# Patient Record
Sex: Male | Born: 1955 | Race: Black or African American | Hispanic: No | Marital: Married | Smoking: Never smoker
Health system: Southern US, Community
[De-identification: ages and names within clinical notes are randomized; demographics above are authoritative.]

## PROBLEM LIST (undated history)

## (undated) DIAGNOSIS — I10 Essential (primary) hypertension: Secondary | ICD-10-CM

## (undated) DIAGNOSIS — E119 Type 2 diabetes mellitus without complications: Secondary | ICD-10-CM

## (undated) DIAGNOSIS — N200 Calculus of kidney: Secondary | ICD-10-CM

## (undated) HISTORY — PX: PROSTATE SURGERY: SHX751

## (undated) HISTORY — PX: CARPAL TUNNEL RELEASE: SHX101

## (undated) HISTORY — DX: Type 2 diabetes mellitus without complications: E11.9

---

## 2004-10-12 ENCOUNTER — Emergency Department: Payer: Self-pay | Admitting: General Practice

## 2011-03-21 ENCOUNTER — Emergency Department: Payer: Self-pay | Admitting: Unknown Physician Specialty

## 2013-08-24 ENCOUNTER — Emergency Department: Payer: Self-pay | Admitting: Emergency Medicine

## 2013-09-30 ENCOUNTER — Ambulatory Visit: Payer: Self-pay | Admitting: Physician Assistant

## 2013-09-30 LAB — URINALYSIS, COMPLETE
Blood: NEGATIVE
Glucose,UR: NEGATIVE mg/dL (ref 0–75)
Leukocyte Esterase: NEGATIVE
Ph: 6.5 (ref 4.5–8.0)
Specific Gravity: 1.025 (ref 1.003–1.030)

## 2014-08-04 DIAGNOSIS — R030 Elevated blood-pressure reading, without diagnosis of hypertension: Secondary | ICD-10-CM | POA: Insufficient documentation

## 2014-08-04 DIAGNOSIS — IMO0001 Reserved for inherently not codable concepts without codable children: Secondary | ICD-10-CM | POA: Insufficient documentation

## 2014-08-04 DIAGNOSIS — B354 Tinea corporis: Secondary | ICD-10-CM | POA: Insufficient documentation

## 2014-08-04 DIAGNOSIS — Z8639 Personal history of other endocrine, nutritional and metabolic disease: Secondary | ICD-10-CM | POA: Insufficient documentation

## 2014-08-04 DIAGNOSIS — L301 Dyshidrosis [pompholyx]: Secondary | ICD-10-CM | POA: Insufficient documentation

## 2014-09-11 ENCOUNTER — Ambulatory Visit: Payer: Self-pay | Admitting: Family Medicine

## 2014-09-19 ENCOUNTER — Ambulatory Visit: Payer: Self-pay | Admitting: Family Medicine

## 2014-10-20 ENCOUNTER — Ambulatory Visit: Payer: Self-pay | Admitting: Family Medicine

## 2014-11-20 ENCOUNTER — Ambulatory Visit: Payer: Self-pay | Admitting: Family Medicine

## 2014-11-29 ENCOUNTER — Ambulatory Visit (INDEPENDENT_AMBULATORY_CARE_PROVIDER_SITE_OTHER): Payer: No Typology Code available for payment source

## 2014-11-29 ENCOUNTER — Encounter: Payer: Self-pay | Admitting: Podiatry

## 2014-11-29 ENCOUNTER — Ambulatory Visit (INDEPENDENT_AMBULATORY_CARE_PROVIDER_SITE_OTHER): Payer: No Typology Code available for payment source | Admitting: Podiatry

## 2014-11-29 VITALS — BP 159/116 | HR 82 | Resp 16 | Ht 68.0 in | Wt 198.0 lb

## 2014-11-29 DIAGNOSIS — E119 Type 2 diabetes mellitus without complications: Secondary | ICD-10-CM

## 2014-11-29 DIAGNOSIS — S93402A Sprain of unspecified ligament of left ankle, initial encounter: Secondary | ICD-10-CM

## 2014-11-29 DIAGNOSIS — M19079 Primary osteoarthritis, unspecified ankle and foot: Secondary | ICD-10-CM

## 2014-11-29 DIAGNOSIS — M129 Arthropathy, unspecified: Secondary | ICD-10-CM

## 2014-11-29 MED ORDER — MELOXICAM 15 MG PO TABS: 15.0000 mg | ORAL_TABLET | Freq: Every day | ORAL | Status: DC

## 2014-11-29 NOTE — Progress Notes (Signed)
   Subjective:    Patient ID: Mitchell Ortiz, male    DOB: [birth year], 59 y.o.   MRN: [id number]  HPI Comments: Left ankle i hurt it. Shooting pain up in it. Hurt it a couple of months ago   Diabetic does not remember the last a1c , blood sugar in the morning was 130   Foot Pain      Review of Systems  Endocrine:       Diabetic   All other systems reviewed and are negative.      Objective:   Physical Exam : I have reviewed his past medical history medications allergies surgery social history review of systems. Vital signs demonstrate severe hypertension and I suggested that he follow-up with his PCP for that. Pulses are moderately palpable bilateral neurologic sensorium is intact per Semmes-Weinstein monofilament. Deep tendon reflexes are intact. Muscle strength +5 over 5 dorsiflexion plantar flexors and inverters everters all into the musculature is intact. Orthopedic evaluation demonstrates limited range of motion of the ankle joint left. He has tenderness on sharp inversion of the subtalar joint and of the ankle joint. 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        Assessment & Plan:  Assessment: Osteoarthritis left greater than right/capsulitis left ankle.  Plan: Injected the left ankle today with Kenalog and local anesthetic. Also wrote a prescription for a meloxicam and put him in a Tri-Lock brace after an anklet for compression. I will follow-up with him in 3-4 weeks to reevaluate. We may need to consider an MRI or CT scan for surgical evaluation.

## 2015-01-01 ENCOUNTER — Ambulatory Visit
Admit: 2015-01-01 | Disposition: A | Discharge status: Home / Self Care | Payer: Self-pay | Attending: Family Medicine | Admitting: Family Medicine

## 2015-01-10 ENCOUNTER — Ambulatory Visit (INDEPENDENT_AMBULATORY_CARE_PROVIDER_SITE_OTHER): Payer: No Typology Code available for payment source | Admitting: Podiatry

## 2015-01-10 VITALS — BP 135/88 | HR 93 | Resp 16

## 2015-01-10 DIAGNOSIS — M779 Enthesopathy, unspecified: Secondary | ICD-10-CM | POA: Diagnosis not present

## 2015-01-10 DIAGNOSIS — M7750 Other enthesopathy of unspecified foot: Secondary | ICD-10-CM

## 2015-01-11 NOTE — Progress Notes (Signed)
He presents today for follow-up of his left ankle. He states that he is approximately 100% better with the exception of this one small area right here as he points to the anterolateral aspect of his left ankle at the anterior talofibular ligament area.  Objective: Vital signs are stable he is alert and oriented 3. Pulses are palpable left. He has mild tenderness on palpation of the ATFL area of the left ankle. Otherwise the ankle has a good full range of motion he has mild tenderness on sharp plantar flexion in the posterior aspect of the ankle.  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  Plan: I reinjected the area today with dexamethasone and local anesthetic have to alleviate some of his symptomatology with this should this recur he will notify us immediately. I will consider sending him to Dr. Loreta AveWagner for surgical consideration

## 2015-08-14 IMAGING — CR DG CHEST 2V
1 series · 2 of 2 positions shown · non-contrast
Comparison: None.

CLINICAL DATA: Right scapular pain

EXAM:
CHEST  2 VIEW

[Series 1: w chest pa · 0.14mm/px · 2 of 2 slices shown]
[im 1/2]
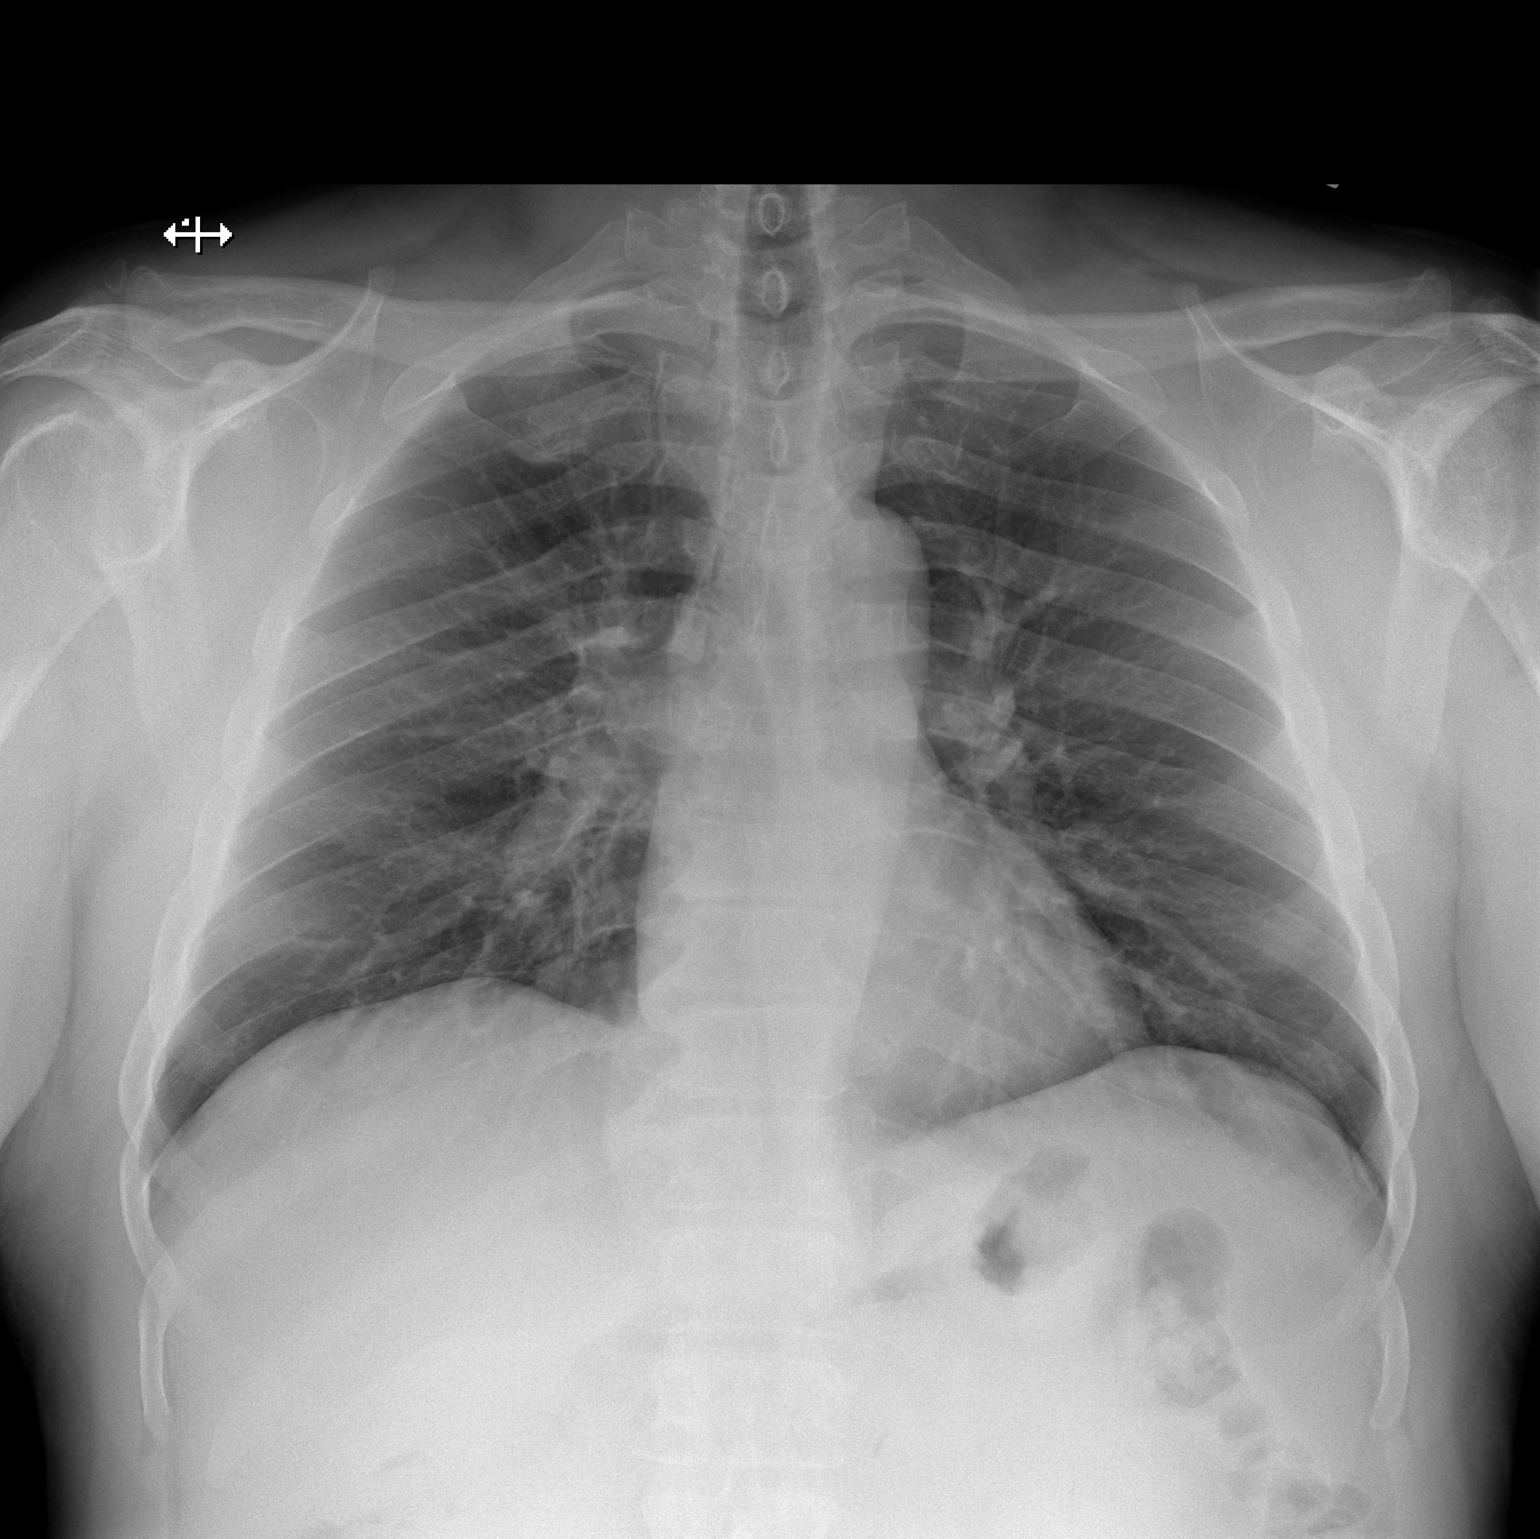
[im 2/2]
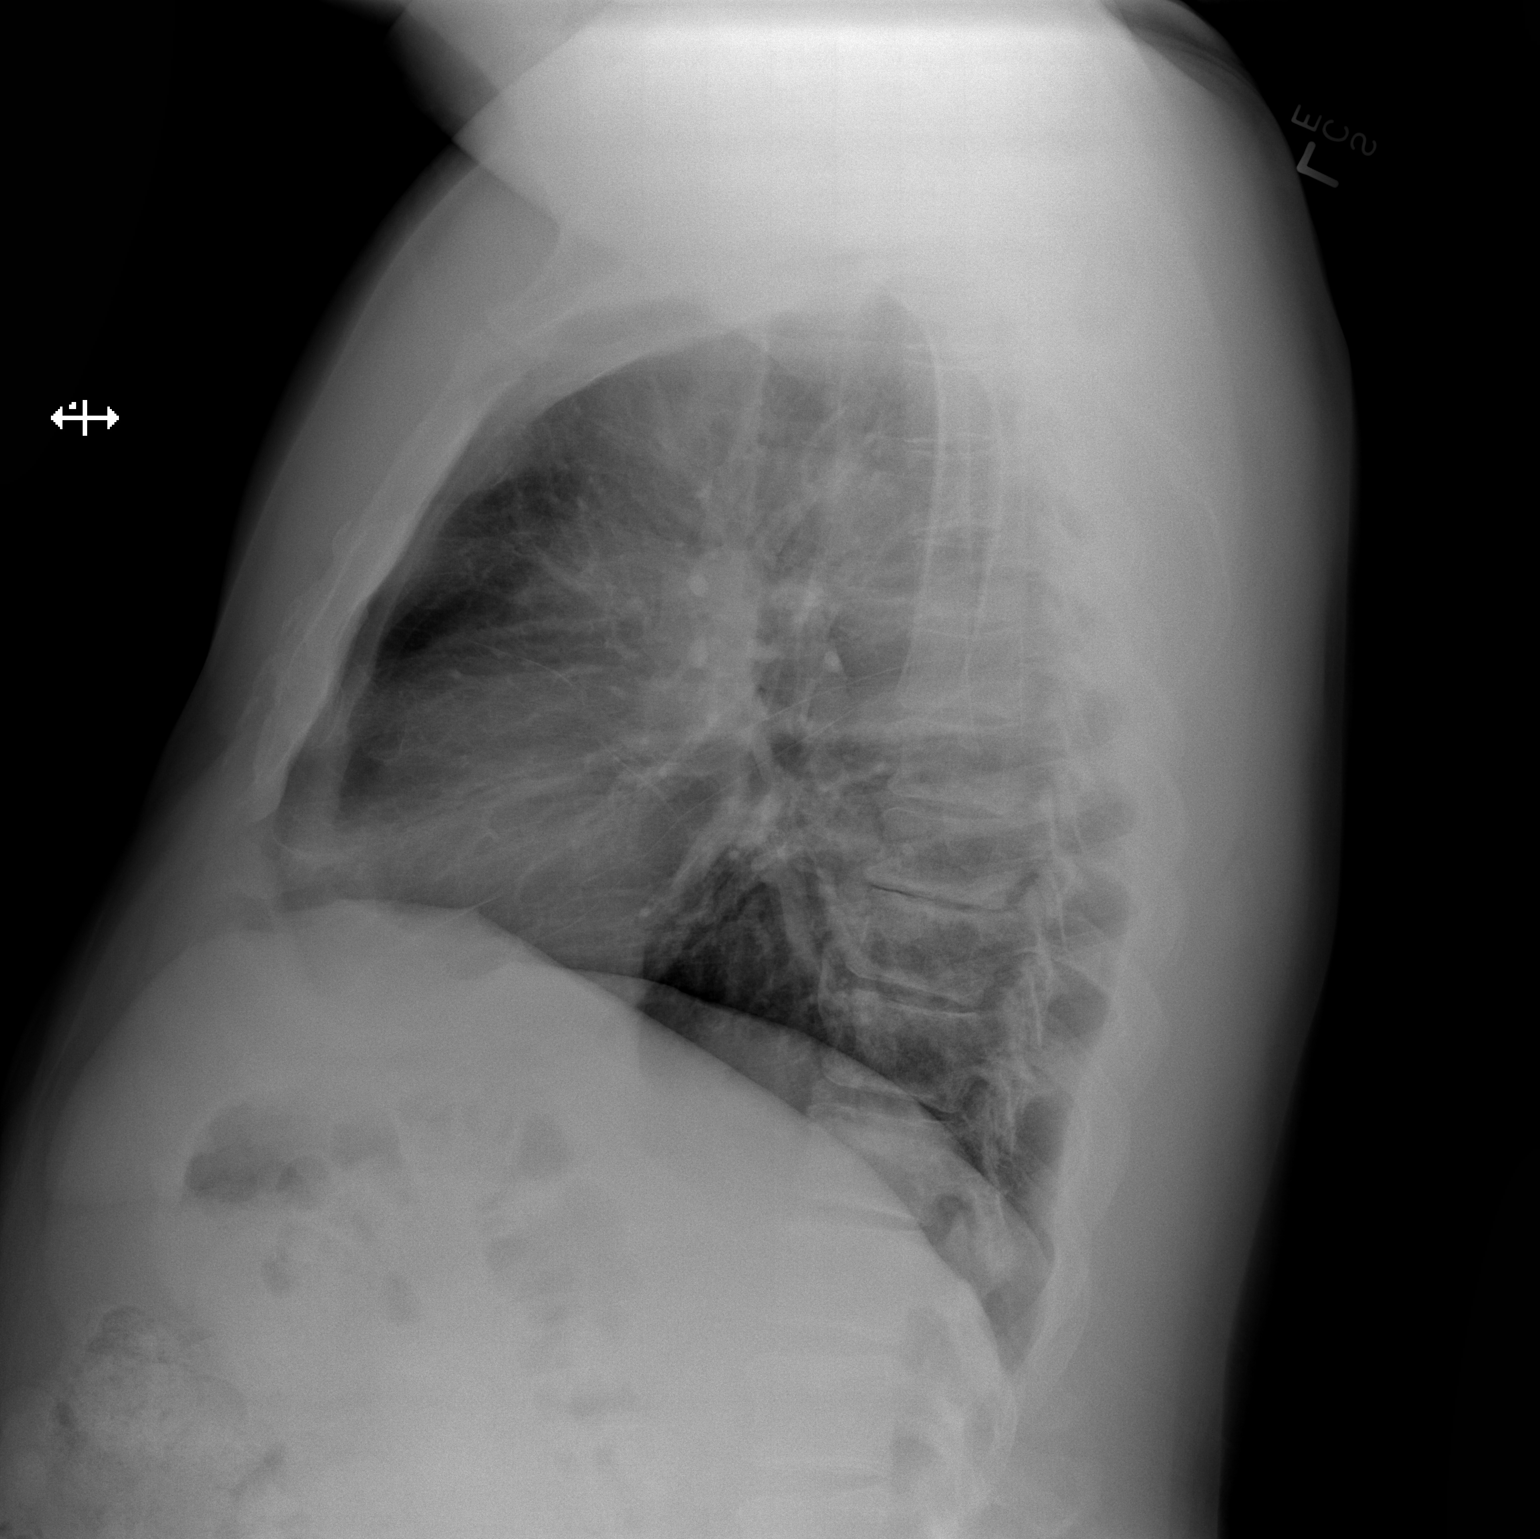

[2 of 2 positions shown; findings below may reference images not displayed]

FINDINGS: The heart size and mediastinal contours are within normal limits.
Both lungs are clear. The visualized skeletal structures are
unremarkable.
IMPRESSION: No active cardiopulmonary disease.

## 2016-08-21 DIAGNOSIS — E119 Type 2 diabetes mellitus without complications: Secondary | ICD-10-CM | POA: Insufficient documentation

## 2017-02-25 DIAGNOSIS — I10 Essential (primary) hypertension: Secondary | ICD-10-CM | POA: Insufficient documentation

## 2018-08-25 DIAGNOSIS — E785 Hyperlipidemia, unspecified: Secondary | ICD-10-CM | POA: Insufficient documentation

## 2018-08-30 ENCOUNTER — Encounter: Payer: Self-pay | Admitting: Emergency Medicine

## 2018-08-30 ENCOUNTER — Other Ambulatory Visit: Payer: Self-pay

## 2018-08-30 ENCOUNTER — Ambulatory Visit
Admission: EM | Admit: 2018-08-30 | Discharge: 2018-08-30 | Disposition: A | Discharge status: Other Acute Inpatient Hospital | Payer: Medicaid Other | Attending: Family Medicine | Admitting: Family Medicine

## 2018-08-30 DIAGNOSIS — R200 Anesthesia of skin: Secondary | ICD-10-CM

## 2018-08-30 DIAGNOSIS — R159 Full incontinence of feces: Secondary | ICD-10-CM

## 2018-08-30 HISTORY — DX: Calculus of kidney: N20.0

## 2018-08-30 NOTE — ED Triage Notes (Signed)
Patient c/o abdominal pain and numbness in bilateral legs since Friday. Patient stated he went to [facility] ER and was diagnosed with sciatica. Patient states he doesn't think that is the problem.

## 2018-08-30 NOTE — ED Provider Notes (Signed)
[facility]    CSN: [id number] Arrival date & time: 08/30/18  4098     History   Chief Complaint Chief Complaint  Patient presents with  . Abdominal Pain  . Numbness    HPI Mitchell Ortiz is a 62 y.o. male.   62 yo male with a c/o abdominal pain associated with bilateral numbness to his legs for the last 4 days. Patient states he was seen at [facility] ED  2 days ago and told he had sciatica. Patient states symptoms have worsened and he feels numbness to his inner thigh and groin area. States this morning he also had an episode where he was incontinent of stool. Denies any injuries, fevers, chills.   The history is provided by the patient.    Past Medical History:  Diagnosis Date  . Diabetes mellitus without complication (HCC)   . Kidney stone     Patient Active Problem List   Diagnosis Date Noted  . 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 Blood pressure elevated 08/04/2014  . H/O diabetes mellitus 08/04/2014  . Body tinea 08/04/2014    Past Surgical History:  Procedure Laterality Date  . CARPAL TUNNEL RELEASE         Home Medications    Prior to Admission medications   Medication Sig Start Date End Date Taking? Authorizing Provider  ketoconazole (NIZORAL) 2 % cream  11/07/14  Yes [provider]  meloxicam (MOBIC) 15 MG tablet Take 1 tablet (15 mg total) by mouth daily. 11/29/14  Yes Hyatt, Max T, DPM  metFORMIN (GLUCOPHAGE-XR) 500 MG 24 hr tablet  11/07/14  Yes [provider]    Family History History reviewed. No pertinent family history.  Social History Social History   Tobacco Use  . Smoking status: Never Smoker  . Smokeless tobacco: Never Used  Substance Use Topics  . Alcohol use: No    Alcohol/week: 0.0 standard drinks  . Drug use: Never     Allergies   Patient has no known allergies.   Review of Systems Review of Systems   Physical Exam Triage Vital Signs ED Triage Vitals  Enc Vitals Group     BP  08/30/18 0936 (!) 142/91     Pulse Rate 08/30/18 0936 80     Resp 08/30/18 0936 18     Temp 08/30/18 0936 98.1 F (36.7 C)     Temp Source 08/30/18 0936 Oral     SpO2 08/30/18 0936 100 %     Weight 08/30/18 0936 228 lb (103.4 kg)     Height 08/30/18 0936 5\' 7"  (1.702 m)     Head Circumference --      Peak Flow --      Pain Score 08/30/18 0935 10     Pain Loc --      Pain Edu? --      Excl. in GC? --    No data found.  Updated Vital Signs BP (!) 142/91 (BP Location: Right Arm)   Pulse 80   Temp 98.1 F (36.7 C) (Oral)   Resp 18   Ht 5\' 7"  (1.702 m)   Wt 103.4 kg   SpO2 100%   BMI 35.71 kg/m   Visual Acuity Right Eye Distance:   Left Eye Distance:   Bilateral Distance:    Right Eye Near:   Left Eye Near:    Bilateral Near:     Physical Exam  Constitutional: He is oriented to person, place, and time. He appears well-developed and  well-nourished.  Non-toxic appearance. He does not appear ill. No distress.  Cardiovascular: Normal rate, regular rhythm, normal heart sounds and intact distal pulses.  Pulmonary/Chest: Effort normal. No respiratory distress.  Abdominal: Soft. Bowel sounds are normal.  Neurological: He is alert and oriented to person, place, and time. He displays normal reflexes. A sensory deficit is present. No cranial nerve deficit. He exhibits normal muscle tone. Coordination normal.  Nursing note and vitals reviewed.    UC Treatments / Results  Labs (all labs ordered are listed, but only abnormal results are displayed) Labs Reviewed - No data to display  EKG None  Radiology No results found.  Procedures Procedures (including critical care time)  Medications Ordered in UC Medications - No data to display  Initial Impression / Assessment and Plan / UC Course  I have reviewed the triage vital signs and the nursing notes.  Pertinent labs & imaging results that were available during my care of the patient were reviewed by me and considered in  my medical decision making (see chart for details).      Final Clinical Impressions(s) / UC Diagnoses   Final diagnoses:  Bilateral leg numbness  Saddle anesthesia  Incontinence of feces, unspecified fecal incontinence type     Discharge Instructions     Recommend patient go to Emergency Department for further evaluation and management    ED Prescriptions    None     1. possible diagnosis reviewed with patient; recommend patient go to Emergency Department for further evaluation and management. Patient verbalizes understanding and will proceed by private vehicle with family member driving.     Controlled Substance Prescriptions [place] Controlled Substance Registry consulted? Not Applicable   Payton Mccallum, MD 08/30/18 1233

## 2018-08-30 NOTE — Discharge Instructions (Signed)
Recommend patient go to Emergency Department for further evaluation and management °

## 2019-07-25 DIAGNOSIS — Z8249 Family history of ischemic heart disease and other diseases of the circulatory system: Secondary | ICD-10-CM | POA: Insufficient documentation

## 2019-08-31 DIAGNOSIS — K429 Umbilical hernia without obstruction or gangrene: Secondary | ICD-10-CM | POA: Insufficient documentation

## 2019-10-21 HISTORY — PX: UMBILICAL HERNIA REPAIR: SHX196

## 2020-07-03 ENCOUNTER — Encounter: Payer: Self-pay | Admitting: Emergency Medicine

## 2020-07-03 ENCOUNTER — Ambulatory Visit
Admission: EM | Admit: 2020-07-03 | Discharge: 2020-07-03 | Disposition: A | Discharge status: Home / Self Care | Payer: Medicare Other | Attending: Emergency Medicine | Admitting: Emergency Medicine

## 2020-07-03 ENCOUNTER — Other Ambulatory Visit: Payer: Self-pay

## 2020-07-03 DIAGNOSIS — R600 Localized edema: Secondary | ICD-10-CM

## 2020-07-03 DIAGNOSIS — Z23 Encounter for immunization: Secondary | ICD-10-CM

## 2020-07-03 DIAGNOSIS — T63451A Toxic effect of venom of hornets, accidental (unintentional), initial encounter: Secondary | ICD-10-CM | POA: Diagnosis not present

## 2020-07-03 DIAGNOSIS — T63461A Toxic effect of venom of wasps, accidental (unintentional), initial encounter: Secondary | ICD-10-CM | POA: Diagnosis not present

## 2020-07-03 DIAGNOSIS — T63441A Toxic effect of venom of bees, accidental (unintentional), initial encounter: Secondary | ICD-10-CM | POA: Diagnosis not present

## 2020-07-03 MED ORDER — TETANUS-DIPHTH-ACELL PERTUSSIS 5-2.5-18.5 LF-MCG/0.5 IM SUSP
0.5000 mL | Freq: Once | INTRAMUSCULAR | Status: AC
  Administered 2020-07-03: 0.5 mL via INTRAMUSCULAR

## 2020-07-03 MED ORDER — IBUPROFEN 600 MG PO TABS: 600.0000 mg | ORAL_TABLET | Freq: Four times a day (QID) | ORAL | 0 refills | Status: DC | PRN

## 2020-07-03 MED ORDER — TRIAMCINOLONE ACETONIDE 0.1 % EX CREA: 1.0000 "application " | TOPICAL_CREAM | Freq: Two times a day (BID) | CUTANEOUS | 0 refills | Status: AC

## 2020-07-03 MED ORDER — HYDROXYZINE HCL 25 MG PO TABS: 25.0000 mg | ORAL_TABLET | Freq: Four times a day (QID) | ORAL | 0 refills | Status: DC | PRN

## 2020-07-03 MED ORDER — EPINEPHRINE 0.3 MG/0.3ML IJ SOAJ: 0.3000 mg | Freq: Once | INTRAMUSCULAR | 0 refills | Status: AC

## 2020-07-03 MED ORDER — FAMOTIDINE 20 MG PO TABS: 20.0000 mg | ORAL_TABLET | Freq: Two times a day (BID) | ORAL | 0 refills | Status: DC

## 2020-07-03 NOTE — Discharge Instructions (Addendum)
Take Claritin or Zyrtec, Pepcid, ibuprofen.  Cool compresses and triamcinolone cream to the areas of swelling.  This should help with the itching.  If the Claritin or Zyrtec does not work, you may use Atarax instead.  We have updated your tetanus today.  I am prescribing an EpiPen in case this progresses to a severe allergic reaction or in case this happens again.

## 2020-07-03 NOTE — ED Provider Notes (Addendum)
HPI  SUBJECTIVE:  Mitchell Ortiz is a 64 y.o. male who presents with multiple yellow jacket stings occurring 4 hours prior to arrival.  He states he was stung on his right forehead, left hand, left forearm, right shoulder and right abdomen.  He reports right periorbital, left hand swelling and forearm itching.  No visual changes, lip or tongue swelling, shortness of breath, wheezing, nausea, vomiting, diarrhea, abdominal pain, syncope, lightheadedness, dizziness.  No aggravating or alleviating factors.  He has not tried anything for this.  He has a past medical history of well-controlled diabetes on Metformin, hypertension, BPH.  No history of anaphylaxis, chronic kidney disease.  He is not sure when his last tetanus was.  PMD: Dr. Yetta Glassman   Past Medical History:  Diagnosis Date  . Diabetes mellitus without complication (HCC)   . Kidney stone     Past Surgical History:  Procedure Laterality Date  . CARPAL TUNNEL RELEASE      History reviewed. No pertinent family history.  Social History   Tobacco Use  . Smoking status: Never Smoker  . Smokeless tobacco: Never Used  Substance Use Topics  . Alcohol use: No    Alcohol/week: 0.0 standard drinks  . Drug use: Never    No current facility-administered medications for this encounter.  Current Outpatient Medications:  .  amLODipine (NORVASC) 10 MG tablet, , Disp: , Rfl:  .  aspirin 81 MG EC tablet, Take by mouth., Disp: , Rfl:  .  atorvastatin (LIPITOR) 10 MG tablet, Take 10 mg by mouth daily., Disp: , Rfl:  .  glipiZIDE (GLUCOTROL XL) 10 MG 24 hr tablet, Take by mouth., Disp: , Rfl:  .  losartan (COZAAR) 50 MG tablet, Take by mouth., Disp: , Rfl:  .  metFORMIN (GLUCOPHAGE-XR) 500 MG 24 hr tablet, , Disp: , Rfl:  .  tamsulosin (FLOMAX) 0.4 MG CAPS capsule, Take by mouth., Disp: , Rfl:  .  EPINEPHrine 0.3 mg/0.3 mL IJ SOAJ injection, Inject 0.3 mg into the muscle once for 1 dose., Disp: 0.3 mL, Rfl: 0 .  famotidine (PEPCID) 20 MG  tablet, Take 1 tablet (20 mg total) by mouth 2 (two) times daily., Disp: 10 tablet, Rfl: 0 .  hydrOXYzine (ATARAX/VISTARIL) 25 MG tablet, Take 1 tablet (25 mg total) by mouth every 6 (six) hours as needed for itching., Disp: 20 tablet, Rfl: 0 .  ibuprofen (ADVIL) 600 MG tablet, Take 1 tablet (600 mg total) by mouth every 6 (six) hours as needed., Disp: 20 tablet, Rfl: 0 .  triamcinolone cream (KENALOG) 0.1 %, Apply 1 application topically 2 (two) times daily. Apply for 2 weeks. May use on face, Disp: 30 g, Rfl: 0  No Known Allergies   ROS  As noted in HPI.   Physical Exam  BP 131/77 (BP Location: Right Arm)   Pulse 77   Temp 98.2 F (36.8 C) (Oral)   Resp 18   Ht 5\' 7"  (1.702 m)   Wt 98.9 kg   SpO2 100%   BMI 34.14 kg/m   Constitutional: Well developed, well nourished, no acute distress Eyes:  EOMI, conjunctiva normal bilaterally.  Positive nonerythematous periorbital swelling.      HENT: Normocephalic, atraumatic,mucus membranes moist.  No angioedema of the lips or tongue.  Airway widely patent. Respiratory: Normal inspiratory effort lungs clear bilaterally Cardiovascular: Normal rate regular rhythm no murmurs rubs or gallops GI: nondistended skin: Positive swelling dorsum left hand, forearm.  Erythematous nontender lesion on right abdomen and on right shoulder.  No stingers in any of these areas.  No flushing, urticaria.           Musculoskeletal: no deformities Neurologic: Alert & oriented x 3, no focal neuro deficits Psychiatric: Speech and behavior appropriate   ED Course   Medications  Tdap (BOOSTRIX) injection 0.5 mL (0.5 mLs Intramuscular Given 07/03/20 1146)    No orders of the defined types were placed in this encounter.   No results found for this or any previous visit (from the past 24 hour(s)). No results found.  ED Clinical Impression  1. Periorbital edema of right eye   2. Toxic reaction to hornets, wasps and bees, accidental or  unintentional, initial encounter   3. Hand edema      ED Assessment/Plan  Updating tetanus.  Patient with multiple yellowjacket stings and localized reaction.  Home with Claritin or Zyrtec, if this does not work, Atarax.  Cool compresses, ibuprofen, Pepcid.  Steroid cream to the areas of swelling because they are isolated and he has diabetes.  Follow-up with primary care physician in several days, to the ER if he gets worse.  He has no history of anaphylaxis or evidence of anaphylaxis here, but will give EpiPen in case this progresses to anaphylaxis.  It has been 4 hours since the incident, so I think that this is unlikely.  Discussed, MDM, treatment plan, and plan for follow-up with patient. Discussed sn/sx that should prompt return to the ED. patient agrees with plan.   Meds ordered this encounter  Medications  . Tdap (BOOSTRIX) injection 0.5 mL  . ibuprofen (ADVIL) 600 MG tablet    Sig: Take 1 tablet (600 mg total) by mouth every 6 (six) hours as needed.    Dispense:  20 tablet    Refill:  0  . famotidine (PEPCID) 20 MG tablet    Sig: Take 1 tablet (20 mg total) by mouth 2 (two) times daily.    Dispense:  10 tablet    Refill:  0  . EPINEPHrine 0.3 mg/0.3 mL IJ SOAJ injection    Sig: Inject 0.3 mg into the muscle once for 1 dose.    Dispense:  0.3 mL    Refill:  0  . triamcinolone cream (KENALOG) 0.1 %    Sig: Apply 1 application topically 2 (two) times daily. Apply for 2 weeks. May use on face    Dispense:  30 g    Refill:  0  . hydrOXYzine (ATARAX/VISTARIL) 25 MG tablet    Sig: Take 1 tablet (25 mg total) by mouth every 6 (six) hours as needed for itching.    Dispense:  20 tablet    Refill:  0    *This clinic note was created using Scientist, clinical (histocompatibility and immunogenetics). Therefore, there may be occasional mistakes despite careful proofreading.   ?    Domenick Gong, MD 07/04/20 1001    Domenick Gong, MD 07/04/20 1002

## 2020-07-03 NOTE — ED Triage Notes (Signed)
Patient c/o being stung by yellow jackets on his forehead, right shoulder, right abdomen and left hand. Patient is having swelling to his right eye and left hand.

## 2020-07-26 DIAGNOSIS — R350 Frequency of micturition: Secondary | ICD-10-CM | POA: Insufficient documentation

## 2020-07-26 DIAGNOSIS — N401 Enlarged prostate with lower urinary tract symptoms: Secondary | ICD-10-CM | POA: Insufficient documentation

## 2020-08-28 ENCOUNTER — Ambulatory Visit: Payer: No Typology Code available for payment source | Admitting: Podiatry

## 2020-08-30 ENCOUNTER — Ambulatory Visit (INDEPENDENT_AMBULATORY_CARE_PROVIDER_SITE_OTHER): Payer: Medicare Other

## 2020-08-30 ENCOUNTER — Other Ambulatory Visit: Payer: Self-pay

## 2020-08-30 ENCOUNTER — Ambulatory Visit (INDEPENDENT_AMBULATORY_CARE_PROVIDER_SITE_OTHER): Payer: Medicare Other | Admitting: Podiatry

## 2020-08-30 ENCOUNTER — Encounter: Payer: Self-pay | Admitting: Podiatry

## 2020-08-30 DIAGNOSIS — M779 Enthesopathy, unspecified: Secondary | ICD-10-CM | POA: Diagnosis not present

## 2020-08-30 DIAGNOSIS — M7752 Other enthesopathy of left foot: Secondary | ICD-10-CM | POA: Diagnosis not present

## 2020-08-31 NOTE — Progress Notes (Signed)
Subjective:  Patient ID: Mitchell Ortiz, male    DOB: 01-Jun-1956,  MRN: [id number]  Chief Complaint  Patient presents with  . Ankle Pain    Patient presents today for left ankle pain and swelling x 1 week    64 y.o. male presents with the above complaint.  Patient presents with left ankle pain that has been going for past 1 week.  There is some swelling associated with it.  Patient states the pain came out of nowhere is some numb feeling 1 for standing.  There are some sharp shooting pain as well.  Patient try Voltaren gel and Aleve Epson salt soaks none of that has helped.  He would like to get it evaluated make sure that there is nothing serious going on.  He denies any other acute complaints.  He is a type II diabetic with last A1c of that is unknown.   Review of Systems: Negative except as noted in the HPI. Denies N/V/F/Ch.  Past Medical History:  Diagnosis Date  . Diabetes mellitus without complication (HCC)   . Kidney stone     Current Outpatient Medications:  .  hydrocortisone 2.5 % cream, Apply 2x a day to affected area (Hips), Disp: , Rfl:  .  silodosin (RAPAFLO) 4 MG CAPS capsule, Take by mouth., Disp: , Rfl:  .  solifenacin (VESICARE) 5 MG tablet, Take by mouth., Disp: , Rfl:  .  amLODipine (NORVASC) 10 MG tablet, , Disp: , Rfl:  .  aspirin 81 MG EC tablet, Take by mouth., Disp: , Rfl:  .  atorvastatin (LIPITOR) 10 MG tablet, Take 10 mg by mouth daily., Disp: , Rfl:  .  econazole nitrate 1 % cream, Apply 1 application topically 2 (two) times daily., Disp: , Rfl:  .  EPINEPHrine 0.3 mg/0.3 mL IJ SOAJ injection, SMARTSIG:0.3 Milliliter(s) IM Once PRN, Disp: , Rfl:  .  famotidine (PEPCID) 20 MG tablet, Take 1 tablet (20 mg total) by mouth 2 (two) times daily., Disp: 10 tablet, Rfl: 0 .  glipiZIDE (GLUCOTROL XL) 10 MG 24 hr tablet, Take by mouth., Disp: , Rfl:  .  hydrOXYzine (ATARAX/VISTARIL) 25 MG tablet, Take 1 tablet (25 mg total) by mouth every 6 (six) hours as needed for  itching., Disp: 20 tablet, Rfl: 0 .  ibuprofen (ADVIL) 600 MG tablet, Take 1 tablet (600 mg total) by mouth every 6 (six) hours as needed., Disp: 20 tablet, Rfl: 0 .  losartan (COZAAR) 50 MG tablet, Take by mouth., Disp: , Rfl:  .  metFORMIN (GLUCOPHAGE-XR) 500 MG 24 hr tablet, , Disp: , Rfl:  .  naproxen (NAPROSYN) 375 MG tablet, Take 375 mg by mouth 2 (two) times daily., Disp: , Rfl:  .  tamsulosin (FLOMAX) 0.4 MG CAPS capsule, Take by mouth., Disp: , Rfl:  .  triamcinolone cream (KENALOG) 0.1 %, Apply 1 application topically 2 (two) times daily. Apply for 2 weeks. May use on face, Disp: 30 g, Rfl: 0  Social History   Tobacco Use  Smoking Status Never Smoker  Smokeless Tobacco Never Used    No Known Allergies Objective:  There were no vitals filed for this visit. There is no height or weight on file to calculate BMI. Constitutional Well developed. Well nourished.  Vascular Dorsalis pedis pulses palpable bilaterally. Posterior tibial pulses palpable bilaterally. Capillary refill normal to all digits.  No cyanosis or clubbing noted. Pedal hair growth normal.  Neurologic Normal speech. Oriented to person, place, and time. Epicritic sensation to light touch  grossly present bilaterally.  Dermatologic Nails well groomed and normal in appearance. No open wounds. No skin lesions.  Orthopedic:  Pain on palpation across the ankle joint medial to lateral gutter.  Pain with range of motion of the ankle joint.  No deep intra-articular pain noted.  No pain at the ATFL, peroneal tendon, posterior tibial tendon, Achilles tendon noted.   Radiographs: 3 views of skeletally mature adult left ankle: Severe osteoarthrosis noted of the ankle joint with uneven joint space narrowing osteophytes.  Midfoot arthrosis noted as well.  Assessment:   1. Tendinitis    Plan:  Patient was evaluated and treated and all questions answered.  Left ankle capsulitis/osteoarthritis -I explained the patient the  etiology of ankle capsulitis and various treatment options were extensively discussed.  Given the amount of pain that he is having I believe patient will benefit from a steroid injection.  This should help decrease some of the osteoarthritic changes are going on the joint and decrease the pain associated from inflammatory conditions in the joint.  I discussed this with patient in extensive detail.  Given the amount of arthritis that he has I am hoping that the steroid injection can give him some relief if not patient is ideal candidate for fusion versus ankle arthroplasty. -A steroid injection was performed at left ankle joint using 1% plain Lidocaine and 10 mg of Kenalog. This was well tolerated.   No follow-ups on file.

## 2020-10-26 DIAGNOSIS — L280 Lichen simplex chronicus: Secondary | ICD-10-CM | POA: Insufficient documentation

## 2020-11-22 ENCOUNTER — Ambulatory Visit
Admission: EM | Admit: 2020-11-22 | Discharge: 2020-11-22 | Disposition: A | Discharge status: Home / Self Care | Payer: Medicare Other | Attending: Emergency Medicine | Admitting: Emergency Medicine

## 2020-11-22 ENCOUNTER — Ambulatory Visit (INDEPENDENT_AMBULATORY_CARE_PROVIDER_SITE_OTHER): Payer: Medicare Other

## 2020-11-22 ENCOUNTER — Other Ambulatory Visit: Payer: Self-pay

## 2020-11-22 DIAGNOSIS — R062 Wheezing: Secondary | ICD-10-CM

## 2020-11-22 DIAGNOSIS — R059 Cough, unspecified: Secondary | ICD-10-CM

## 2020-11-22 DIAGNOSIS — J209 Acute bronchitis, unspecified: Secondary | ICD-10-CM

## 2020-11-22 HISTORY — DX: Calculus of kidney: N20.0

## 2020-11-22 MED ORDER — ALBUTEROL SULFATE HFA 108 (90 BASE) MCG/ACT IN AERS: 2.0000 | INHALATION_SPRAY | RESPIRATORY_TRACT | 0 refills | Status: AC | PRN

## 2020-11-22 MED ORDER — BENZONATATE 100 MG PO CAPS: 200.0000 mg | ORAL_CAPSULE | Freq: Three times a day (TID) | ORAL | 0 refills | Status: DC

## 2020-11-22 MED ORDER — PREDNISONE 10 MG (21) PO TBPK: ORAL_TABLET | Freq: Every day | ORAL | 0 refills | Status: DC

## 2020-11-22 MED ORDER — PROMETHAZINE-DM 6.25-15 MG/5ML PO SYRP: 5.0000 mL | ORAL_SOLUTION | Freq: Four times a day (QID) | ORAL | 0 refills | Status: DC | PRN

## 2020-11-22 MED ORDER — AEROCHAMBER MV MISC: 2 refills | Status: DC

## 2020-11-22 NOTE — ED Provider Notes (Signed)
[facility]    CSN: [id number] Arrival date & time: 11/22/20  1628      History   Chief Complaint Chief Complaint  Patient presents with  . chest congestion    HPI Mitchell Ortiz is a 65 y.o. male.   HPI   65 year old male here for evaluation of wheezing and productive cough x2 weeks.  Reports that he has been coughing up green mucus.  His cough increases when he lays flat.  Patient denies shortness of breath, sore throat, fever, or ear pressure.  Patient has had some intermittent nasal congestion.  Past Medical History:  Diagnosis Date  . Diabetes mellitus without complication (HCC)   . Kidney stone   . Kidney stones     Patient Active Problem List   Diagnosis Date Noted  . Benign prostatic hyperplasia with urinary frequency 07/26/2020  . Umbilical hernia without obstruction and without gangrene 08/31/2019  . Family history of heart disease in brother 07/25/2019  . Hyperlipidemia 08/25/2018  . Hypertension 02/25/2017  . Type 2 diabetes mellitus without complication, without long-term current use of insulin (HCC) 08/21/2016  . 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 Blood pressure elevated 08/04/2014  . H/O diabetes mellitus 08/04/2014  . Body tinea 08/04/2014  . Blood pressure elevated without history of HTN 08/04/2014    Past Surgical History:  Procedure Laterality Date  . CARPAL TUNNEL RELEASE    . UMBILICAL HERNIA REPAIR  2021       Home Medications    Prior to Admission medications   Medication Sig Start Date End Date Taking? Authorizing Provider  albuterol (VENTOLIN HFA) 108 (90 Base) MCG/ACT inhaler Inhale 2 puffs into the lungs every 4 (four) hours as needed. 11/22/20  Yes Becky Augusta, NP  amLODipine (NORVASC) 10 MG tablet  06/15/20  Yes [provider]  aspirin 81 MG EC tablet Take by mouth. 05/26/16  Yes [provider]  atorvastatin (LIPITOR) 10 MG tablet Take 10 mg by mouth daily. 06/26/20  Yes [provider]   benzonatate (TESSALON) 100 MG capsule Take 2 capsules (200 mg total) by mouth every 8 (eight) hours. 11/22/20  Yes Becky Augusta, NP  econazole nitrate 1 % cream Apply 1 application topically 2 (two) times daily. 04/19/20  Yes [provider]  EPINEPHrine 0.3 mg/0.3 mL IJ SOAJ injection SMARTSIG:0.3 Milliliter(s) IM Once PRN 07/03/20  Yes [provider]  glipiZIDE (GLUCOTROL XL) 10 MG 24 hr tablet Take by mouth. 06/27/16  Yes [provider]  hydrocortisone 2.5 % cream Apply 2x a day to affected area (Hips) 08/06/17  Yes [provider]  losartan (COZAAR) 50 MG tablet Take by mouth. 05/15/20 05/15/21 Yes [provider]  metFORMIN (GLUCOPHAGE-XR) 500 MG 24 hr tablet  11/07/14  Yes [provider]  naproxen (NAPROSYN) 375 MG tablet Take 375 mg by mouth 2 (two) times daily. 04/23/20  Yes [provider]  predniSONE (STERAPRED UNI-PAK 21 TAB) 10 MG (21) TBPK tablet Take by mouth daily. Take 6 tabs by mouth daily  for 2 days, then 5 tabs for 2 days, then 4 tabs for 2 days, then 3 tabs for 2 days, 2 tabs for 2 days, then 1 tab by mouth daily for 2 days 11/22/20  Yes Becky Augusta, NP  promethazine-dextromethorphan (PROMETHAZINE-DM) 6.25-15 MG/5ML syrup Take 5 mLs by mouth 4 (four) times daily as needed. 11/22/20  Yes Becky Augusta, NP  silodosin (RAPAFLO) 4 MG CAPS capsule Take by mouth. 07/24/20  Yes [provider]  solifenacin (VESICARE) 5 MG tablet Take by mouth. 07/24/20 07/24/21 Yes [provider]  Spacer/Aero-Holding Chambers (AEROCHAMBER MV) inhaler Use as instructed 11/22/20  Yes Becky Augusta, NP  tamsulosin [facility]) 0.4 MG CAPS capsule Take by mouth. 03/15/20 03/15/21 Yes [provider]  triamcinolone cream (KENALOG) 0.1 % Apply 1 application topically 2 (two) times daily. Apply for 2 weeks. May use on face 07/03/20  Yes Domenick Gong, MD  famotidine (PEPCID) 20 MG tablet Take 1 tablet (20 mg total) by mouth 2 (two) times  daily. 07/03/20 11/22/20  Domenick Gong, MD    Family History History reviewed. No pertinent family history.  Social History Social History   Tobacco Use  . Smoking status: Never Smoker  . Smokeless tobacco: Never Used  Vaping Use  . Vaping Use: Never used  Substance Use Topics  . Alcohol use: No    Alcohol/week: 0.0 standard drinks  . Drug use: Never     Allergies   Patient has no known allergies.   Review of Systems Review of Systems  Constitutional: Negative for activity change, appetite change and fever.  HENT: Positive for congestion. Negative for ear pain, rhinorrhea and sore throat.   Respiratory: Positive for cough and wheezing. Negative for shortness of breath.   Musculoskeletal: Negative for arthralgias and myalgias.  Skin: Negative for rash.  Hematological: Negative.   Psychiatric/Behavioral: Negative.      Physical Exam Triage Vital Signs ED Triage Vitals  Enc Vitals Group     BP 11/22/20 1639 137/86     Pulse Rate 11/22/20 1639 86     Resp 11/22/20 1639 18     Temp 11/22/20 1639 98.1 F (36.7 C)     Temp Source 11/22/20 1639 Oral     SpO2 11/22/20 1639 96 %     Weight 11/22/20 1636 216 lb (98 kg)     Height 11/22/20 1636 5\' 7"  (1.702 m)     Head Circumference --      Peak Flow --      Pain Score 11/22/20 1636 0     Pain Loc --      Pain Edu? --      Excl. in GC? --    No data found.  Updated Vital Signs BP 137/86 (BP Location: Left Arm)   Pulse 86   Temp 98.1 F (36.7 C) (Oral)   Resp 18   Ht 5\' 7"  (1.702 m)   Wt 216 lb (98 kg)   SpO2 96%   BMI 33.83 kg/m   Visual Acuity Right Eye Distance:   Left Eye Distance:   Bilateral Distance:    Right Eye Near:   Left Eye Near:    Bilateral Near:     Physical Exam Vitals and nursing note reviewed.  Constitutional:      General: He is not in acute distress.    Appearance: Normal appearance. He is not toxic-appearing.  HENT:     Head: Normocephalic and atraumatic.     Right Ear:  Tympanic membrane, ear canal and external ear normal.     Left Ear: Tympanic membrane, ear canal and external ear normal.     Nose: Congestion and rhinorrhea present.     Comments: The mucosa is mildly erythematous and edematous with scant clear nasal discharge.    Mouth/Throat:     Mouth: Mucous membranes are moist.     Pharynx: Oropharynx is clear. No posterior oropharyngeal erythema.  Cardiovascular:     Rate and Rhythm: Normal  rate and regular rhythm.     Pulses: Normal pulses.     Heart sounds: Normal heart sounds.  Pulmonary:     Effort: Pulmonary effort is normal.     Breath sounds: Normal breath sounds. No wheezing, rhonchi or rales.  Musculoskeletal:     Cervical back: Normal range of motion and neck supple.  Lymphadenopathy:     Cervical: No cervical adenopathy.  Skin:    General: Skin is warm and dry.     Capillary Refill: Capillary refill takes less than 2 seconds.     Findings: No erythema or rash.  Neurological:     General: No focal deficit present.     Mental Status: He is alert and oriented to person, place, and time.  Psychiatric:        Mood and Affect: Mood normal.        Behavior: Behavior normal.        Thought Content: Thought content normal.        Judgment: Judgment normal.      UC Treatments / Results  Labs (all labs ordered are listed, but only abnormal results are displayed) Labs Reviewed - No data to display  EKG   Radiology DG Chest 2 View  Result Date: 11/22/2020 CLINICAL DATA:  65 year old male with cough and wheezing. EXAM: CHEST - 2 VIEW COMPARISON:  Chest radiograph dated 08/24/2013. FINDINGS: No focal consolidation, pleural effusion, or pneumothorax. The cardiac silhouette is within limits. No acute osseous pathology. Degenerative changes of the spine. IMPRESSION: No active cardiopulmonary disease. Electronically Signed   By: Elgie Collard M.D.   On: 11/22/2020 17:19    Procedures Procedures (including critical care  time)  Medications Ordered in UC Medications - No data to display  Initial Impression / Assessment and Plan / UC Course  I have reviewed the triage vital signs and the nursing notes.  Pertinent labs & imaging results that were available during my care of the patient were reviewed by me and considered in my medical decision making (see chart for details).   Patient is a very pleasant 65 year old male here for evaluation of productive cough and wheezing that have been going on for last 2 weeks.  Patient states that he is producing a green sputum when he coughs.  Patient is not tachypneic and can speak in full sentences.  Lungs are clear to auscultation in all fields.  Patient states that he typically gets sick with a respiratory illness this time of year and several times it has turned into pneumonia.  Will obtain chest x-ray.  X-ray is negative for pneumonia per radiology read.  We will treat patient for bronchitis with prednisone, Tessalon Perles, Promethazine DM, and albuterol inhaler with a spacer.   Final Clinical Impressions(s) / UC Diagnoses   Final diagnoses:  Acute bronchitis, unspecified organism     Discharge Instructions     Chest x-ray did not show any evidence of pneumonia.  Your symptoms are more consistent with bronchitis.  Take the prednisone as prescribed starting tomorrow morning with breakfast to help with inflammation.  Use the albuterol inhaler with a spacer, 2 puffs every 4-6 hours, as needed for wheezing.  Use the Tessalon Perles during the day as needed for cough.  Take them with a small sip of water.  They may give you a numbness to the base of your tongue and a metallic taste in her mouth, this is normal.  Use the Promethazine DM cough syrup at bedtime for cough  and congestion.  This will make you drowsy.  Follow-up with your primary care provider for any new or worsening symptoms.    ED Prescriptions    Medication Sig Dispense Auth. Provider    albuterol (VENTOLIN HFA) 108 (90 Base) MCG/ACT inhaler Inhale 2 puffs into the lungs every 4 (four) hours as needed. 18 g Becky Augusta, NP   Spacer/Aero-Holding Chambers (AEROCHAMBER MV) inhaler Use as instructed 1 each Becky Augusta, NP   benzonatate (TESSALON) 100 MG capsule Take 2 capsules (200 mg total) by mouth every 8 (eight) hours. 21 capsule Becky Augusta, NP   promethazine-dextromethorphan (PROMETHAZINE-DM) 6.25-15 MG/5ML syrup Take 5 mLs by mouth 4 (four) times daily as needed. 118 mL Becky Augusta, NP   predniSONE (STERAPRED UNI-PAK 21 TAB) 10 MG (21) TBPK tablet Take by mouth daily. Take 6 tabs by mouth daily  for 2 days, then 5 tabs for 2 days, then 4 tabs for 2 days, then 3 tabs for 2 days, 2 tabs for 2 days, then 1 tab by mouth daily for 2 days 42 tablet Becky Augusta, NP     PDMP not reviewed this encounter.   Becky Augusta, NP 11/22/20 1736

## 2020-11-22 NOTE — Discharge Instructions (Addendum)
Chest x-ray did not show any evidence of pneumonia.  Your symptoms are more consistent with bronchitis.  Take the prednisone as prescribed starting tomorrow morning with breakfast to help with inflammation.  Use the albuterol inhaler with a spacer, 2 puffs every 4-6 hours, as needed for wheezing.  Use the Tessalon Perles during the day as needed for cough.  Take them with a small sip of water.  They may give you a numbness to the base of your tongue and a metallic taste in her mouth, this is normal.  Use the Promethazine DM cough syrup at bedtime for cough and congestion.  This will make you drowsy.  Follow-up with your primary care provider for any new or worsening symptoms.

## 2020-11-22 NOTE — ED Triage Notes (Signed)
Pt c/o productive cough with green mucus for about 2 weeks. Pt states he does have hx of pna. Pt is concerned he may have it again. Pt denies f/n/v/d or other symptoms. Pt denies any known sick contacts. Pt states he regularly gets sick around this time of year.

## 2021-01-11 DIAGNOSIS — N528 Other male erectile dysfunction: Secondary | ICD-10-CM | POA: Insufficient documentation

## 2021-02-12 ENCOUNTER — Ambulatory Visit (INDEPENDENT_AMBULATORY_CARE_PROVIDER_SITE_OTHER): Payer: 59 | Admitting: Podiatry

## 2021-02-12 ENCOUNTER — Encounter: Payer: Self-pay | Admitting: Podiatry

## 2021-02-12 ENCOUNTER — Other Ambulatory Visit: Payer: Self-pay

## 2021-02-12 DIAGNOSIS — M19072 Primary osteoarthritis, left ankle and foot: Secondary | ICD-10-CM

## 2021-02-12 DIAGNOSIS — M7752 Other enthesopathy of left foot: Secondary | ICD-10-CM

## 2021-02-12 DIAGNOSIS — Z01818 Encounter for other preprocedural examination: Secondary | ICD-10-CM | POA: Diagnosis not present

## 2021-02-12 NOTE — Progress Notes (Signed)
Subjective:  Patient ID: Mitchell Ortiz, male    DOB: September 14, 1956,  MRN: [id number]  Chief Complaint  Patient presents with  . Ankle Pain    Patient presents today with continued ankle pain and swelling bilat    65 y.o. male presents with the above complaint.  Patient presents with left ankle pain that did not get resolved as much with steroid injection.  Patient states that steroid injection lasted for about 3 weeks do not last any longer.  He would like to reschedule another steroid injection.  He would also like to discuss surgical options as he has not gotten any relief from bracing immobilization.  Patient states it continues to hurt with every step.  He states that there is a lot of arthritis in that ankle.  He denies any other acute complaints.  He is an diabetic with last A1c of 8.5%.   Review of Systems: Negative except as noted in the HPI. Denies N/V/F/Ch.  Past Medical History:  Diagnosis Date  . Diabetes mellitus without complication (HCC)   . Kidney stone   . Kidney stones     Current Outpatient Medications:  .  albuterol (VENTOLIN HFA) 108 (90 Base) MCG/ACT inhaler, Inhale 2 puffs into the lungs every 4 (four) hours as needed., Disp: 18 g, Rfl: 0 .  amLODipine (NORVASC) 10 MG tablet, , Disp: , Rfl:  .  aspirin 81 MG EC tablet, Take by mouth., Disp: , Rfl:  .  atorvastatin (LIPITOR) 10 MG tablet, Take 10 mg by mouth daily., Disp: , Rfl:  .  benzonatate (TESSALON) 100 MG capsule, Take 2 capsules (200 mg total) by mouth every 8 (eight) hours., Disp: 21 capsule, Rfl: 0 .  econazole nitrate 1 % cream, Apply 1 application topically 2 (two) times daily., Disp: , Rfl:  .  EPINEPHrine 0.3 mg/0.3 mL IJ SOAJ injection, SMARTSIG:0.3 Milliliter(s) IM Once PRN, Disp: , Rfl:  .  glipiZIDE (GLUCOTROL XL) 10 MG 24 hr tablet, Take by mouth., Disp: , Rfl:  .  hydrocortisone 2.5 % cream, Apply 2x a day to affected area (Hips), Disp: , Rfl:  .  losartan (COZAAR) 50 MG tablet, Take by mouth.,  Disp: , Rfl:  .  metFORMIN (GLUCOPHAGE-XR) 500 MG 24 hr tablet, , Disp: , Rfl:  .  naproxen (NAPROSYN) 375 MG tablet, Take 375 mg by mouth 2 (two) times daily., Disp: , Rfl:  .  predniSONE (STERAPRED UNI-PAK 21 TAB) 10 MG (21) TBPK tablet, Take by mouth daily. Take 6 tabs by mouth daily  for 2 days, then 5 tabs for 2 days, then 4 tabs for 2 days, then 3 tabs for 2 days, 2 tabs for 2 days, then 1 tab by mouth daily for 2 days, Disp: 42 tablet, Rfl: 0 .  promethazine-dextromethorphan (PROMETHAZINE-DM) 6.25-15 MG/5ML syrup, Take 5 mLs by mouth 4 (four) times daily as needed., Disp: 118 mL, Rfl: 0 .  silodosin (RAPAFLO) 4 MG CAPS capsule, Take by mouth., Disp: , Rfl:  .  solifenacin (VESICARE) 5 MG tablet, Take by mouth., Disp: , Rfl:  .  Spacer/Aero-Holding Chambers (AEROCHAMBER MV) inhaler, Use as instructed, Disp: 1 each, Rfl: 2 .  tamsulosin (FLOMAX) 0.4 MG CAPS capsule, Take by mouth., Disp: , Rfl:  .  triamcinolone cream (KENALOG) 0.1 %, Apply 1 application topically 2 (two) times daily. Apply for 2 weeks. May use on face, Disp: 30 g, Rfl: 0  Social History   Tobacco Use  Smoking Status Never Smoker  Smokeless Tobacco  Never Used    No Known Allergies Objective:  There were no vitals filed for this visit. There is no height or weight on file to calculate BMI. Constitutional Well developed. Well nourished.  Vascular Dorsalis pedis pulses palpable bilaterally. Posterior tibial pulses palpable bilaterally. Capillary refill normal to all digits.  No cyanosis or clubbing noted. Pedal hair growth normal.  Neurologic Normal speech. Oriented to person, place, and time. Epicritic sensation to light touch grossly present bilaterally.  Dermatologic Nails well groomed and normal in appearance. No open wounds. No skin lesions.  Orthopedic:  Pain on palpation across the ankle joint medial to lateral gutter.  Pain with range of motion of the ankle joint.  No deep intra-articular pain noted.  No  pain at the ATFL, peroneal tendon, posterior tibial tendon, Achilles tendon noted.   Radiographs: 3 views of skeletally mature adult left ankle: Severe osteoarthrosis noted of the ankle joint with uneven joint space narrowing osteophytes.  Midfoot arthrosis noted as well.  Assessment:   1. Preoperative examination   2. Arthritis of ankle, left   3. Capsulitis of ankle, left    Plan:  Patient was evaluated and treated and all questions answered.  Left ankle capsulitis/osteoarthritis with underlying uncontrolled diabetes -I explained the patient the etiology of ankle capsulitis and various treatment options were extensively discussed.  Given the amount of pain that he is having I believe patient will benefit from a steroid injection.   -A second steroid injection was performed at left ankle joint using 1% plain Lidocaine and 10 mg of Kenalog. This was well tolerated. -I discussed with him in extensive detail given the amount of arthritis that is present I believe ultimately patient will benefit from surgical fusion/arthroplasty as he has severe amount of arthritis in that ankle joint.  However, given his diabetic and his A1c of 8.5% I believe minimal options are the best options for him.  I discussed with him that given the amount of arthritis he may not benefit from arthroscopy however he would like to still proceed with that to allow decreasing arthritis and some of the pain.  I discussed with him in extensive detail that he may not be able to get get the relief he is looking for given the nature of the arthritis however he still like proceed with it.  He does have 8.5% A1c.  I educated him to decrease that.  He states understanding will work on that.  For now he may benefit from minimal portals with ankle arthroscopy with debridement/intervention.  He would like to proceed with the surgery despite all the risk and complication including wound healing. -He will be weightbearing as tolerated in  surgical shoe -Informed surgical risk consent was reviewed and read aloud to the patient.  I reviewed the films.  I have discussed my findings with the patient in great detail.  I have discussed all risks including but not limited to infection, stiffness, scarring, limp, disability, deformity, damage to blood vessels and nerves, numbness, poor healing, need for braces, arthritis, chronic pain, amputation, death.  All benefits and realistic expectations discussed in great detail.  I have made no promises as to the outcome.  I have provided realistic expectations.  I have offered the patient a 2nd opinion, which they have declined and assured me they preferred to proceed despite the risks -A total of 33 minutes was spent in direct patient care as well as pre and post patient encounter activities.  This includes documentation as well  as reviewing patient chart for labs, imaging, past medical, surgical, social, and family history as documented in the EMR.  I have reviewed medication allergies as documented in EMR.  I discussed the etiology of condition and treatment options from conservative to surgical care.  All risks and benefit of the treatment course was discussed in detail.  All questions were answered and return appointment was discussed.  Since the visit completed in an ambulatory/outpatient setting, the patient and/or parent/guardian has been advised to contact the providers office for worsening condition and seek medical treatment and/or call 911 if the patient deems either is necessary.    No follow-ups on file.

## 2021-03-21 DIAGNOSIS — R748 Abnormal levels of other serum enzymes: Secondary | ICD-10-CM | POA: Insufficient documentation

## 2021-04-12 ENCOUNTER — Telehealth: Payer: Self-pay | Admitting: Urology

## 2021-04-12 NOTE — Telephone Encounter (Addendum)
DOS  - 07/22/21   ARTHROSCOPY WITH INTERVENTION LEFT ANKLE --- [id number]   [employer] EFFECTIVE DATE - 12/18/20   PLAN DEDUCTIBLE - $203.00 W/ $0.00 REMAINING OUT OF POCKET - Member's individual out-of-pocket maximum has no limit. COINSURANCE - 20% COPAY - $0.00   PER [facility] SITE CPT CODE 32992 HAS BEEN APPROVED, AUTH # [id number], GOOD FROM 04/29/21 - 07/28/21.

## 2021-04-21 ENCOUNTER — Encounter: Payer: Self-pay | Admitting: Emergency Medicine

## 2021-04-21 ENCOUNTER — Ambulatory Visit
Admission: EM | Admit: 2021-04-21 | Discharge: 2021-04-21 | Disposition: A | Discharge status: Home / Self Care | Payer: 59 | Attending: Emergency Medicine | Admitting: Emergency Medicine

## 2021-04-21 ENCOUNTER — Other Ambulatory Visit: Payer: Self-pay

## 2021-04-21 DIAGNOSIS — R220 Localized swelling, mass and lump, head: Secondary | ICD-10-CM | POA: Diagnosis not present

## 2021-04-21 DIAGNOSIS — T63461A Toxic effect of venom of wasps, accidental (unintentional), initial encounter: Secondary | ICD-10-CM | POA: Diagnosis not present

## 2021-04-21 DIAGNOSIS — T7840XA Allergy, unspecified, initial encounter: Secondary | ICD-10-CM | POA: Diagnosis not present

## 2021-04-21 MED ORDER — DEXAMETHASONE SODIUM PHOSPHATE 10 MG/ML IJ SOLN
10.0000 mg | Freq: Once | INTRAMUSCULAR | Status: AC
  Administered 2021-04-21: 10 mg via INTRAMUSCULAR

## 2021-04-21 MED ORDER — PREDNISONE 10 MG (21) PO TBPK: ORAL_TABLET | ORAL | 0 refills | Status: DC

## 2021-04-21 NOTE — Discharge Instructions (Addendum)
Take over-the-counter Allegra 180 mg daily or Zyrtec or Claritin 10 mg daily to help with your itching.  You can take over-the-counter Benadryl, 50 mg at bedtime, as needed for itching and sleep.  Take the prednisone pack according to the package instructions.  You will taken on tapering dose over a period of 6 days.  Take it with food and always take it first in the morning with breakfast.  Take over-the-counter Pepcid 20 mg twice daily to help with itching as well.  If you develop any swelling of your lips or tongue, tightness in your throat, or difficulty breathing you need to go to the ER for evaluation.  

## 2021-04-21 NOTE — ED Provider Notes (Signed)
[facility]    CSN: [id number] Arrival date & time: 04/21/21  2671      History   Chief Complaint Chief Complaint  Patient presents with   Insect Bite   Facial Swelling    HPI Mitchell Ortiz is a 65 y.o. male.   HPI  65 year old male here for evaluation of facial swelling after wasp sting.  Patient reports that he was mowing grass yesterday and he was stung on his left ear by a red wasp.  This caused swelling to his left ear, the left side of his face, and left neck.  He states he feels like he is having a little bit of shortness of breath due to the pressure of the swelling.  He has an EpiPen but he did not take it and he has not taken any Benadryl.  Patient is able to speak in full sentences, there is no stridor, and patient is in no acute distress.  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 Authorizing Provider  predniSONE (STERAPRED UNI-PAK 21 TAB) 10 MG (21) TBPK tablet Take 6 tablets on day 1, 5 tablets day 2, 4 tablets day 3, 3 tablets day 4, 2 tablets day 5, 1 tablet  day 6 04/21/21  Yes Becky Augusta, NP  albuterol (VENTOLIN HFA) 108 (90 Base) MCG/ACT inhaler Inhale 2 puffs into the lungs every 4 (four) hours as needed. 11/22/20   Becky Augusta, NP  amLODipine (NORVASC) 10 MG tablet  06/15/20   [provider]  aspirin 81 MG EC tablet Take by mouth. 05/26/16   [provider]  atorvastatin (LIPITOR) 10 MG tablet Take 10 mg by mouth daily. 06/26/20   [provider]  econazole nitrate 1 % cream Apply 1 application topically 2 (two) times daily. 04/19/20   [provider]  EPINEPHrine 0.3 mg/0.3 mL IJ SOAJ injection SMARTSIG:0.3 Milliliter(s) IM Once PRN 07/03/20   [provider]  glipiZIDE (GLUCOTROL XL) 10 MG 24 hr tablet Take by mouth. 06/27/16   [provider]  hydrocortisone 2.5 % cream Apply 2x a day to affected area (Hips) 08/06/17   [provider]  losartan (COZAAR) 50 MG tablet Take by mouth. 05/15/20 05/15/21  [provider]  metFORMIN (GLUCOPHAGE-XR) 500 MG 24 hr tablet  11/07/14   [provider]  naproxen (NAPROSYN) 375 MG tablet Take 375 mg by mouth 2 (two) times daily. 04/23/20   [provider]  silodosin (RAPAFLO) 4 MG CAPS capsule Take by mouth. 07/24/20   [provider]  solifenacin (VESICARE) 5 MG  tablet Take by mouth. 07/24/20 07/24/21  [provider]  triamcinolone cream (KENALOG) 0.1 % Apply 1 application topically 2 (two) times daily. Apply for 2 weeks. May use on face 07/03/20   Domenick Gong, MD  famotidine (PEPCID) 20 MG tablet Take 1 tablet (20 mg total) by mouth 2 (two) times daily. 07/03/20 11/22/20  Domenick Gong, MD    Family History History reviewed. No pertinent family history.  Social History Social History   Tobacco Use   Smoking status: Never   Smokeless tobacco: Never  Vaping Use   Vaping Use: Never used  Substance Use Topics   Alcohol use: No    Alcohol/week: 0.0 standard drinks   Drug use: Never     Allergies    Bee venom   Review of Systems Review of Systems  Constitutional:  Negative for diaphoresis.  HENT:  Positive for facial swelling.   Respiratory:  Positive for shortness of breath. Negative for wheezing.   Gastrointestinal:  Negative for diarrhea, nausea and vomiting.  Skin:  Positive for color change.  Neurological:  Negative for syncope and light-headedness.    Physical Exam Triage Vital Signs ED Triage Vitals  Enc Vitals Group     BP      Pulse      Resp      Temp      Temp src      SpO2      Weight      Height      Head Circumference      Peak Flow      Pain Score      Pain Loc      Pain Edu?      Excl. in GC?    No data found.  Updated Vital Signs BP (!) 141/88 (BP Location: Right Arm)   Pulse 79   Temp 98.4 F (36.9 C) (Oral)   Resp 20   SpO2 98%   Visual Acuity Right Eye Distance:   Left Eye Distance:   Bilateral Distance:    Right Eye Near:   Left Eye Near:    Bilateral Near:     Physical Exam Vitals and nursing note reviewed.  Constitutional:      General: He is not in acute distress.    Appearance: Normal appearance. He is not ill-appearing or diaphoretic.  HENT:     Head: Normocephalic.     Mouth/Throat:     Mouth: Mucous membranes are moist.     Pharynx: Oropharynx is clear. No oropharyngeal exudate or posterior oropharyngeal erythema.  Cardiovascular:     Rate and Rhythm: Normal rate and regular rhythm.     Pulses: Normal pulses.     Heart sounds: Normal heart sounds.  Pulmonary:     Effort: Pulmonary effort is normal.     Breath sounds: Normal breath sounds. No stridor. No wheezing, rhonchi or rales.  Musculoskeletal:     Cervical back: Normal range of motion and neck supple. No tenderness.  Skin:    General: Skin is warm.     Capillary Refill: Capillary refill takes less than 2 seconds.     Findings: Erythema present.  Neurological:     General: No focal deficit present.     Mental Status: He is alert and oriented to person,  place, and time.  Psychiatric:        Mood and Affect: Mood normal.        Behavior: Behavior normal.  Thought Content: Thought content normal.        Judgment: Judgment normal.     UC Treatments / Results  Labs (all labs ordered are listed, but only abnormal results are displayed) Labs Reviewed - No data to display  EKG   Radiology No results found.  Procedures Procedures (including critical care time)  Medications Ordered in UC Medications  dexamethasone (DECADRON) injection 10 mg (10 mg Intramuscular Given 04/21/21 0857)    Initial Impression / Assessment and Plan / UC Course  I have reviewed the triage vital signs and the nursing notes.  Pertinent labs & imaging results that were available during my care of the patient were reviewed by me and considered in my medical decision making (see chart for details).  Patient is a very pleasant and nontoxic-appearing 65 year old male here for evaluation of left-sided facial swelling after being stung by a wasp yesterday as outlined in HPI above.  Patient's physical exam reveals mild swelling to the left side of the patient's face and the pinna of the left ear with mild erythema.  The swelling extends down into the left side of the neck.  There is no induration or warmth to the swelling and patient does not have any pain or tenderness when palpating the tissues around the periorbital region of the left eye.  He is not having any vision difficulties.  Oropharyngeal exam reveals a mild body score of 2 with no erythema or edema.  The neck is supple and there is no stridor when auscultating over the trachea.  Cardiopulmonary exam is benign.  We will treat patient for allergic reaction secondary to the wasp sting with IM Decadron 10 mg here in clinic as his pharmacy does not open until Tuesday.  The Decadron will carry him until he can start the prednisone pack.  We will have patient initiate antihistamine therapy today using a combination  of Allegra, Claritin, or Zyrtec during the day and Benadryl in the evening.  I will also have the patient take Pepcid 20 mg twice daily.  Patient advised that if he develops more facial swelling, swelling of his lips or tongue, or difficulty breathing he is to administer his EpiPen and call 911 and go to the ER.  Patient verbalized understanding of same.   Final Clinical Impressions(s) / UC Diagnoses   Final diagnoses:  Facial swelling  Allergic reaction, initial encounter  Wasp sting, accidental or unintentional, initial encounter     Discharge Instructions      Take over-the-counter Allegra 180 mg daily or Zyrtec or Claritin 10 mg daily to help with your itching.  You can take over-the-counter Benadryl, 50 mg at bedtime, as needed for itching and sleep.  Take the prednisone pack according to the package instructions.  You will taken on tapering dose over a period of 6 days.  Take it with food and always take it first in the morning with breakfast.  Take over-the-counter Pepcid 20 mg twice daily to help with itching as well.  If you develop any swelling of your lips or tongue, tightness in your throat, or difficulty breathing you need to go to the ER for evaluation.      ED Prescriptions     Medication Sig Dispense Auth. Provider   predniSONE (STERAPRED UNI-PAK 21 TAB) 10 MG (21) TBPK tablet Take 6 tablets on day 1, 5 tablets day 2, 4 tablets day 3, 3 tablets day 4, 2 tablets day 5, 1 tablet day 6 21 tablet Becky Augusta,  NP      PDMP not reviewed this encounter.   Kyel Purk, JeremyBecky Augusta, NP 04/21/21 [phone]

## 2021-04-21 NOTE — ED Triage Notes (Signed)
Pt presents today with c/o of facial swelling from red wasp sting to left outer ear yesterday. He is alert and oriented. RR 20,. 02 sat 99% and in no apparent distress. No OTC meds pta.

## 2021-05-07 ENCOUNTER — Encounter: Payer: 59 | Admitting: Podiatry

## 2021-05-21 ENCOUNTER — Encounter: Payer: 59 | Admitting: Podiatry

## 2021-07-22 ENCOUNTER — Other Ambulatory Visit: Payer: Self-pay | Admitting: Podiatry

## 2021-07-22 DIAGNOSIS — M19072 Primary osteoarthritis, left ankle and foot: Secondary | ICD-10-CM | POA: Diagnosis not present

## 2021-07-22 MED ORDER — OXYCODONE-ACETAMINOPHEN 5-325 MG PO TABS: 1.0000 | ORAL_TABLET | ORAL | 0 refills | Status: DC | PRN

## 2021-07-26 ENCOUNTER — Telehealth: Payer: Self-pay

## 2021-07-26 NOTE — Telephone Encounter (Signed)
Loura Halt, PT with [facility] called requesting clarification on his weightbearing status.  She stated that he did not want to wear the boot and she wanted to make sure he was weightbearing as tolerated.  Please advise Lupita Leash.  Thanks

## 2021-07-30 ENCOUNTER — Other Ambulatory Visit: Payer: Self-pay

## 2021-07-30 ENCOUNTER — Ambulatory Visit (INDEPENDENT_AMBULATORY_CARE_PROVIDER_SITE_OTHER): Payer: 59 | Admitting: Podiatry

## 2021-07-30 DIAGNOSIS — Z9889 Other specified postprocedural states: Secondary | ICD-10-CM

## 2021-07-30 DIAGNOSIS — M19072 Primary osteoarthritis, left ankle and foot: Secondary | ICD-10-CM

## 2021-08-02 NOTE — Progress Notes (Signed)
Subjective:  Patient ID: Mitchell Ortiz, male    DOB: [birth year],  MRN: [id number]  Chief Complaint  Patient presents with   Routine Post Op    Dos 10.3.22    DOS: 07/22/2021 Procedure: Left ankle arthroscopy with intervention  65 y.o. male returns for post-op check.  Patient states that he is doing well.  He is able to ambulate with boot on.  The ankle does feel a little bit better.  He denies any other acute complaints.  His bandages are clean dry and intact  Review of Systems: Negative except as noted in the HPI. Denies N/V/F/Ch.  Past Medical History:  Diagnosis Date   Diabetes mellitus without complication (HCC)    Kidney stone    Kidney stones     Current Outpatient Medications:    albuterol (VENTOLIN HFA) 108 (90 Base) MCG/ACT inhaler, Inhale 2 puffs into the lungs every 4 (four) hours as needed., Disp: 18 g, Rfl: 0   amLODipine (NORVASC) 10 MG tablet, , Disp: , Rfl:    aspirin 81 MG EC tablet, Take by mouth., Disp: , Rfl:    atorvastatin (LIPITOR) 10 MG tablet, Take 10 mg by mouth daily., Disp: , Rfl:    econazole nitrate 1 % cream, Apply 1 application topically 2 (two) times daily., Disp: , Rfl:    EPINEPHrine 0.3 mg/0.3 mL IJ SOAJ injection, SMARTSIG:0.3 Milliliter(s) IM Once PRN, Disp: , Rfl:    glipiZIDE (GLUCOTROL XL) 10 MG 24 hr tablet, Take by mouth., Disp: , Rfl:    hydrocortisone 2.5 % cream, Apply 2x a day to affected area (Hips), Disp: , Rfl:    losartan (COZAAR) 50 MG tablet, Take by mouth., Disp: , Rfl:    metFORMIN (GLUCOPHAGE-XR) 500 MG 24 hr tablet, , Disp: , Rfl:    naproxen (NAPROSYN) 375 MG tablet, Take 375 mg by mouth 2 (two) times daily., Disp: , Rfl:    oxyCODONE-acetaminophen (PERCOCET) 5-325 MG tablet, Take 1 tablet by mouth every 4 (four) hours as needed for severe pain., Disp: 30 tablet, Rfl: 0   predniSONE (STERAPRED UNI-PAK 21 TAB) 10 MG (21) TBPK tablet, Take 6 tablets on day 1, 5 tablets day 2, 4 tablets day 3, 3 tablets day 4, 2 tablets day  5, 1 tablet day 6, Disp: 21 tablet, Rfl: 0   silodosin (RAPAFLO) 4 MG CAPS capsule, Take by mouth., Disp: , Rfl:    triamcinolone cream (KENALOG) 0.1 %, Apply 1 application topically 2 (two) times daily. Apply for 2 weeks. May use on face, Disp: 30 g, Rfl: 0  Social History   Tobacco Use  Smoking Status Never  Smokeless Tobacco Never    Allergies  Allergen Reactions   Bee Venom Swelling   Objective:  There were no vitals filed for this visit. There is no height or weight on file to calculate BMI. Constitutional Well developed. Well nourished.  Vascular Foot warm and well perfused. Capillary refill normal to all digits.   Neurologic Normal speech. Oriented to person, place, and time. Epicritic sensation to light touch grossly present bilaterally.  Dermatologic Skin healing well without signs of infection. Skin edges well coapted without signs of infection.  Orthopedic: Tenderness to palpation noted about the surgical site.   Radiographs: None Assessment:   1. Arthritis of ankle, left   2. Status post foot surgery    Plan:  Patient was evaluated and treated and all questions answered.  S/p foot surgery left -Progressing as expected post-operatively. -XR: None -WB Status:  Weightbearing as tolerated in cam boot -Sutures: Intact.  No clinical signs of dehiscence noted.  No complication noted. -Medications: None -I discussed my findings with the patient in extensive detail which showed moderate to severe amount of osteoarthritic changes in that ankle joint.  I discussed with them that I was able to debride down as much as I can eventually patient may be a candidate for ankle fusion versus ankle implant.  No follow-ups on file.

## 2021-08-13 ENCOUNTER — Ambulatory Visit (INDEPENDENT_AMBULATORY_CARE_PROVIDER_SITE_OTHER): Payer: 59 | Admitting: Podiatry

## 2021-08-13 ENCOUNTER — Encounter: Payer: Self-pay | Admitting: Podiatry

## 2021-08-13 ENCOUNTER — Other Ambulatory Visit: Payer: Self-pay

## 2021-08-13 DIAGNOSIS — Z9889 Other specified postprocedural states: Secondary | ICD-10-CM

## 2021-08-13 DIAGNOSIS — M19072 Primary osteoarthritis, left ankle and foot: Secondary | ICD-10-CM

## 2021-08-13 NOTE — Progress Notes (Signed)
Subjective:  Patient ID: Mitchell Ortiz, male    DOB: 1955/12/18,  MRN: [id number]  Chief Complaint  Patient presents with   Routine Post Op    "It was doing good the same day that he did the surgery on it."    DOS: 07/22/2021 Procedure: Left ankle arthroscopy with intervention  65 y.o. male returns for post-op check.  Patient states that he is doing well.  He is able to ambulate with boot on.  He does not have any further pain.  Has been ambulating with the boot on.  He is ready go to regular shoes.  Review of Systems: Negative except as noted in the HPI. Denies N/V/F/Ch.  Past Medical History:  Diagnosis Date   Diabetes mellitus without complication (HCC)    Kidney stone    Kidney stones     Current Outpatient Medications:    albuterol (VENTOLIN HFA) 108 (90 Base) MCG/ACT inhaler, Inhale 2 puffs into the lungs every 4 (four) hours as needed., Disp: 18 g, Rfl: 0   amLODipine (NORVASC) 10 MG tablet, , Disp: , Rfl:    aspirin 81 MG EC tablet, Take by mouth., Disp: , Rfl:    atorvastatin (LIPITOR) 10 MG tablet, Take 10 mg by mouth daily., Disp: , Rfl:    econazole nitrate 1 % cream, Apply 1 application topically 2 (two) times daily., Disp: , Rfl:    EPINEPHrine 0.3 mg/0.3 mL IJ SOAJ injection, SMARTSIG:0.3 Milliliter(s) IM Once PRN, Disp: , Rfl:    glipiZIDE (GLUCOTROL XL) 10 MG 24 hr tablet, Take by mouth., Disp: , Rfl:    hydrocortisone 2.5 % cream, Apply 2x a day to affected area (Hips), Disp: , Rfl:    losartan (COZAAR) 50 MG tablet, Take by mouth., Disp: , Rfl:    metFORMIN (GLUCOPHAGE-XR) 500 MG 24 hr tablet, , Disp: , Rfl:    naproxen (NAPROSYN) 375 MG tablet, Take 375 mg by mouth 2 (two) times daily., Disp: , Rfl:    oxyCODONE-acetaminophen (PERCOCET) 5-325 MG tablet, Take 1 tablet by mouth every 4 (four) hours as needed for severe pain., Disp: 30 tablet, Rfl: 0   predniSONE (STERAPRED UNI-PAK 21 TAB) 10 MG (21) TBPK tablet, Take 6 tablets on day 1, 5 tablets day 2, 4  tablets day 3, 3 tablets day 4, 2 tablets day 5, 1 tablet day 6, Disp: 21 tablet, Rfl: 0   silodosin (RAPAFLO) 4 MG CAPS capsule, Take by mouth., Disp: , Rfl:    triamcinolone cream (KENALOG) 0.1 %, Apply 1 application topically 2 (two) times daily. Apply for 2 weeks. May use on face, Disp: 30 g, Rfl: 0  Social History   Tobacco Use  Smoking Status Never  Smokeless Tobacco Never    Allergies  Allergen Reactions   Bee Venom Swelling   Objective:  There were no vitals filed for this visit. There is no height or weight on file to calculate BMI. Constitutional Well developed. Well nourished.  Vascular Foot warm and well perfused. Capillary refill normal to all digits.   Neurologic Normal speech. Oriented to person, place, and time. Epicritic sensation to light touch grossly present bilaterally.  Dermatologic Skin completely epithelialized.  Good range of motion noted at the ankle joint.  No crepitus noted.  Orthopedic: No further tenderness to palpation noted about the surgical site.   Radiographs: None Assessment:   1. Status post surgery   2. Arthritis of ankle, left    Plan:  Patient was evaluated and treated and all  questions answered.  S/p foot surgery left -Progressing as expected post-operatively. -XR: None -WB Status: Weightbearing as tolerated in cam boot -Sutures: Removed no clinical signs of dehiscence noted.  No complication noted. -Medications: None -I discussed my findings with the patient in extensive detail which showed moderate to severe amount of osteoarthritic changes in that ankle joint.  I discussed with them that I was able to debride down as much as I can eventually patient may be a candidate for ankle fusion versus ankle implant. -Patient is officially discharged from my care if any foot and ankle issues arise in future of asked him to come see me. No follow-ups on file.

## 2022-01-08 DIAGNOSIS — R351 Nocturia: Secondary | ICD-10-CM | POA: Insufficient documentation

## 2022-01-08 DIAGNOSIS — M6289 Other specified disorders of muscle: Secondary | ICD-10-CM | POA: Insufficient documentation

## 2022-01-08 DIAGNOSIS — N3946 Mixed incontinence: Secondary | ICD-10-CM | POA: Insufficient documentation

## 2022-01-28 ENCOUNTER — Ambulatory Visit
Admission: EM | Admit: 2022-01-28 | Discharge: 2022-01-28 | Disposition: A | Discharge status: Home / Self Care | Payer: 59 | Attending: Physician Assistant | Admitting: Physician Assistant

## 2022-01-28 DIAGNOSIS — R109 Unspecified abdominal pain: Secondary | ICD-10-CM | POA: Insufficient documentation

## 2022-01-28 DIAGNOSIS — N10 Acute tubulo-interstitial nephritis: Secondary | ICD-10-CM | POA: Insufficient documentation

## 2022-01-28 DIAGNOSIS — R300 Dysuria: Secondary | ICD-10-CM | POA: Insufficient documentation

## 2022-01-28 HISTORY — DX: Essential (primary) hypertension: I10

## 2022-01-28 LAB — URINALYSIS, ROUTINE W REFLEX MICROSCOPIC
Bilirubin Urine: NEGATIVE
Glucose, UA: 250 mg/dL — AB
Ketones, ur: NEGATIVE mg/dL
Nitrite: POSITIVE — AB
Protein, ur: 30 mg/dL — AB
Specific Gravity, Urine: 1.02 (ref 1.005–1.030)
pH: 6 (ref 5.0–8.0)

## 2022-01-28 LAB — URINALYSIS, MICROSCOPIC (REFLEX): WBC, UA: >50 WBC/hpf (ref 0–5)

## 2022-01-28 MED ORDER — PHENAZOPYRIDINE HCL 200 MG PO TABS: 200.0000 mg | ORAL_TABLET | Freq: Three times a day (TID) | ORAL | 0 refills | Status: DC

## 2022-01-28 MED ORDER — CIPROFLOXACIN HCL 500 MG PO TABS: 500.0000 mg | ORAL_TABLET | Freq: Two times a day (BID) | ORAL | 0 refills | Status: AC

## 2022-01-28 NOTE — ED Provider Notes (Signed)
?[facility] ? ? ? ?CSN: [id number] ?Arrival date & time: 01/28/22  0907 ? ? ?  ? ?History   ?Chief Complaint ?Chief Complaint  ?Patient presents with  ? Flank Pain  ?  L  ? ? ?HPI ?Mitchell Ortiz is a 66 y.o. male with history of diabetes, hypertension, BPH with urinary symptoms, hyperlipidemia, and history of nephrolithiasis.  Patient presents today for left flank pain for the past 2 days.  Denies injury.  Also reports increased urinary frequency from baseline and burning with urination.  Does not report any blood in the urine.  Pain seems to be a little worse when "twisting."  No fever, chills, nausea or vomiting.  Denies testicular pain or urethral discharge.  Patient reports history of kidney infections and believes he may have a kidney infection now.  He has no other complaints. ? ?HPI ? ?Past Medical History:  ?Diagnosis Date  ? Diabetes mellitus without complication (HCC)   ? Hypertension   ? Kidney stone   ? Kidney stones   ? ? ?Patient Active Problem List  ? Diagnosis Date Noted  ? Benign prostatic hyperplasia with urinary frequency 07/26/2020  ? Umbilical hernia without obstruction and without gangrene 08/31/2019  ? Family history of heart disease in brother 07/25/2019  ? Hyperlipidemia 08/25/2018  ? Hypertension 02/25/2017  ? Type 2 diabetes mellitus without complication, without long-term current use of insulin (HCC) 08/21/2016  ? 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 Blood pressure elevated 08/04/2014  ? H/O diabetes mellitus 08/04/2014  ? Body tinea 08/04/2014  ? Blood pressure elevated without history of HTN 08/04/2014  ? ? ?Past Surgical History:  ?Procedure Laterality Date  ? CARPAL TUNNEL RELEASE    ? UMBILICAL HERNIA REPAIR  2021  ? ? ? ? ? ?Home Medications   ? ?Prior to Admission medications   ?Medication Sig Start Date End Date Taking? Authorizing Provider  ?albuterol (VENTOLIN HFA) 108 (90 Base) MCG/ACT inhaler Inhale 2 puffs into the lungs every 4 (four) hours as needed. 11/22/20  Yes  Becky Augusta, NP  ?amLODipine (NORVASC) 10 MG tablet  06/15/20  Yes [provider]  ?aspirin 81 MG EC tablet Take by mouth. 05/26/16  Yes [provider]  ?atorvastatin (LIPITOR) 10 MG tablet Take 10 mg by mouth daily. 06/26/20  Yes [provider]  ?ciprofloxacin (CIPRO) 500 MG tablet Take 1 tablet (500 mg total) by mouth every 12 (twelve) hours for 14 days. 01/28/22 02/11/22 Yes Eusebio Friendly B, PA-C  ?econazole nitrate 1 % cream Apply 1 application topically 2 (two) times daily. 04/19/20  Yes [provider]  ?EPINEPHrine 0.3 mg/0.3 mL IJ SOAJ injection SMARTSIG:0.3 Milliliter(s) IM Once PRN 07/03/20  Yes [provider]  ?glipiZIDE (GLUCOTROL XL) 10 MG 24 hr tablet Take by mouth. 06/27/16  Yes [provider]  ?hydrocortisone 2.5 % cream Apply 2x a day to affected area (Hips) 08/06/17  Yes [provider]  ?losartan (COZAAR) 50 MG tablet Take by mouth. 05/15/20 01/28/22 Yes [provider]  ?metFORMIN (GLUCOPHAGE-XR) 500 MG 24 hr tablet  11/07/14  Yes [provider]  ?naproxen (NAPROSYN) 375 MG tablet Take 375 mg by mouth 2 (two) times daily. 04/23/20  Yes [provider]  ?phenazopyridine (PYRIDIUM) 200 MG tablet Take 1 tablet (200 mg total) by mouth 3 (three) times daily. 01/28/22  Yes Eusebio Friendly B, PA-C  ?silodosin (RAPAFLO) 4 MG CAPS capsule Take by mouth. 07/24/20  Yes [provider]  ?triamcinolone cream (KENALOG) 0.1 %  Apply 1 application topically 2 (two) times daily. Apply for 2 weeks. May use on face 07/03/20   Domenick GongMortenson, Ashley, MD  ?famotidine (PEPCID) 20 MG tablet Take 1 tablet (20 mg total) by mouth 2 (two) times daily. 07/03/20 11/22/20  Domenick GongMortenson, Ashley, MD  ? ? ?Family History ?History reviewed. No pertinent family history. ? ?Social History ?Social History  ? ?Tobacco Use  ? Smoking status: Never  ? Smokeless tobacco: Never  ?Vaping Use  ? Vaping Use: Never used  ?Substance Use Topics  ? Alcohol use: No  ?   Alcohol/week: 0.0 standard drinks  ? Drug use: Never  ? ? ? ?Allergies   ?Bee venom ? ? ?Review of Systems ?Review of Systems  ?Constitutional:  Negative for fatigue and fever.  ?Gastrointestinal:  Negative for abdominal pain, nausea and vomiting.  ?Genitourinary:  Positive for difficulty urinating, dysuria, flank pain and frequency. Negative for genital sores, hematuria, penile discharge, penile pain, penile swelling, scrotal swelling, testicular pain and urgency.  ?Musculoskeletal:  Positive for back pain. Negative for arthralgias.  ?Skin:  Negative for rash.  ?Neurological:  Negative for weakness.  ? ? ?Physical Exam ?Triage Vital Signs ?ED Triage Vitals  ?Enc Vitals Group  ?   BP 01/28/22 0943 (!) 140/92  ?   Pulse Rate 01/28/22 0943 73  ?   Resp 01/28/22 0943 18  ?   Temp 01/28/22 0943 98.2 ?F (36.8 ?C)  ?   Temp Source 01/28/22 0943 Oral  ?   SpO2 01/28/22 0943 99 %  ?   Weight --   ?   Height --   ?   Head Circumference --   ?   Peak Flow --   ?   Pain Score 01/28/22 0938 10  ?   Pain Loc --   ?   Pain Edu? --   ?   Excl. in GC? --   ? ?No data found. ? ?Updated Vital Signs ?BP (!) 140/92 (BP Location: Left Arm)   Pulse 73   Temp 98.2 ?F (36.8 ?C) (Oral)   Resp 18   SpO2 99%  ? ?   ? ?Physical Exam ?Vitals and nursing note reviewed.  ?Constitutional:   ?   General: He is not in acute distress. ?   Appearance: Normal appearance. He is well-developed. He is not ill-appearing.  ?HENT:  ?   Head: Normocephalic and atraumatic.  ?Eyes:  ?   General: No scleral icterus. ?   Conjunctiva/sclera: Conjunctivae normal.  ?Cardiovascular:  ?   Rate and Rhythm: Normal rate and regular rhythm.  ?   Heart sounds: Normal heart sounds.  ?Pulmonary:  ?   Effort: Pulmonary effort is normal. No respiratory distress.  ?   Breath sounds: Normal breath sounds.  ?Abdominal:  ?   Palpations: Abdomen is soft.  ?   Tenderness: There is no abdominal tenderness. There is left CVA tenderness. There is no right CVA tenderness.   ?Musculoskeletal:  ?   Cervical back: Neck supple.  ?Skin: ?   General: Skin is warm and dry.  ?   Capillary Refill: Capillary refill takes less than 2 seconds.  ?Neurological:  ?   General: No focal deficit present.  ?   Mental Status: He is alert. Mental status is at baseline.  ?   Motor: No weakness.  ?   Coordination: Coordination normal.  ?   Gait: Gait normal.  ?Psychiatric:     ?   Mood and Affect: Mood  normal.     ?   Behavior: Behavior normal.     ?   Thought Content: Thought content normal.  ? ? ? ?UC Treatments / Results  ?Labs ?(all labs ordered are listed, but only abnormal results are displayed) ?Labs Reviewed  ?URINALYSIS, ROUTINE W REFLEX MICROSCOPIC - Abnormal; Notable for the following components:  ?    Result Value  ? APPearance CLOUDY (*)   ? Glucose, UA 250 (*)   ? Hgb urine dipstick SMALL (*)   ? Protein, ur 30 (*)   ? Nitrite POSITIVE (*)   ? Leukocytes,Ua MODERATE (*)   ? All other components within normal limits  ?URINALYSIS, MICROSCOPIC (REFLEX) - Abnormal; Notable for the following components:  ? Bacteria, UA MANY (*)   ? All other components within normal limits  ?URINE CULTURE  ? ? ?EKG ? ? ?Radiology ?No results found. ? ?Procedures ?Procedures (including critical care time) ? ?Medications Ordered in UC ?Medications - No data to display ? ?Initial Impression / Assessment and Plan / UC Course  ?I have reviewed the triage vital signs and the nursing notes. ? ?Pertinent labs & imaging results that were available during my care of the patient were reviewed by me and considered in my medical decision making (see chart for details). ? ?66 year old male with history of diabetes, hypertension, hyperlipidemia, BPH with LUTS and nephrolithiasis presents for left flank pain x2 days.  Also reports dysuria and urinary frequency. ? ?Patient is afebrile and overall well-appearing.  Exam significant for left-sided CVA tenderness.  Patient also has umbilical hernia which is nontender. ? ?Urinalysis  obtained.  Urinalysis shows cloudy appearance of urine, glucose, small hemoglobin, positive nitrites and moderate leukocytes.  We will send urine for culture and treat for a sending urinary tract infection given the flank pa

## 2022-01-28 NOTE — Discharge Instructions (Signed)
UTI: Based on either symptoms or urinalysis, you may have a urinary tract infection. We will send the urine for culture and call with results in a few days. Begin antibiotics at this time. Your symptoms should be much improved over the next 2-3 days. Increase rest and fluid intake. If for some reason symptoms are worsening or not improving after a couple of days or the urine culture determines the antibiotics you are taking will not treat the infection, the antibiotics may be changed. Return or go to ER for fever, back pain, worsening urinary pain, discharge, increased blood in urine. May take Tylenol or Motrin OTC for pain relief or consider AZO if no contraindications  ? ?If pain worsens or you develop a fever, you should go to the emergency department. ?

## 2022-01-28 NOTE — ED Triage Notes (Signed)
L flank pain x 2 days.  Burning with urination.  Denies fevers, hematuria. Reports h/o kidney infection and kidney stones.   ?

## 2022-01-31 LAB — URINE CULTURE: Culture: >=100000 — AB

## 2022-02-19 ENCOUNTER — Ambulatory Visit
Admission: EM | Admit: 2022-02-19 | Discharge: 2022-02-19 | Disposition: A | Discharge status: Home / Self Care | Payer: 59

## 2022-02-19 DIAGNOSIS — T1591XA Foreign body on external eye, part unspecified, right eye, initial encounter: Secondary | ICD-10-CM | POA: Diagnosis not present

## 2022-02-19 DIAGNOSIS — S0501XA Injury of conjunctiva and corneal abrasion without foreign body, right eye, initial encounter: Secondary | ICD-10-CM

## 2022-02-19 MED ORDER — CIPROFLOXACIN HCL 0.3 % OP SOLN: OPHTHALMIC | 0 refills | Status: AC

## 2022-02-19 NOTE — ED Provider Notes (Signed)
?[facility] ? ? ? ?CSN: [id number] ?Arrival date & time: 02/19/22  1450 ? ? ?  ? ?History   ?Chief Complaint ?Chief Complaint  ?Patient presents with  ? Eye Problem  ? ? ?HPI ?Mitchell Ortiz is a 66 y.o. male presenting for right eye pain.  Patient also reports foreign body sensation.  He reports that he thinks he got something in his eye when he was cutting grass yesterday.  Reports that the eye feels irritated, is a little red and he has clear tearing.  Reports he is flushed the eye out himself and it did not seem to make a big difference.  He denies any severe pain or photophobia.  No headaches or dizziness.  Denies vomiting.  No visual loss.  No other complaints. ? ?HPI ? ?Past Medical History:  ?Diagnosis Date  ? Diabetes mellitus without complication (HCC)   ? Hypertension   ? Kidney stone   ? Kidney stones   ? ? ?Patient Active Problem List  ? Diagnosis Date Noted  ? Pelvic floor weakness, male 01/08/2022  ? Nocturia 01/08/2022  ? 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 Elevated alkaline phosphatase level 03/21/2021  ? Other male erectile dysfunction 01/11/2021  ? Lichen simplex chronicus 10/26/2020  ? Benign prostatic hyperplasia with urinary frequency 07/26/2020  ? Umbilical hernia without obstruction and without gangrene 08/31/2019  ? Family history of heart disease in brother 07/25/2019  ? Hyperlipidemia 08/25/2018  ? Hypertension 02/25/2017  ? Type 2 diabetes mellitus without complication, without long-term current use of insulin (HCC) 08/21/2016  ? 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 Blood pressure elevated 08/04/2014  ? H/O diabetes mellitus 08/04/2014  ? Body tinea 08/04/2014  ? Blood pressure elevated without history of HTN 08/04/2014  ? ? ?Past Surgical History:  ?Procedure Laterality Date  ? CARPAL TUNNEL RELEASE    ? UMBILICAL HERNIA REPAIR  2021  ? ? ? ? ? ?Home Medications   ? ?Prior to Admission medications   ?Medication Sig Start Date End Date Taking? Authorizing  Provider  ?aspirin 81 MG EC tablet Take by mouth. 05/26/16  Yes [provider]  ?ciprofloxacin (CILOXAN) 0.3 % ophthalmic solution Administer 1 drop, every 2 hours, while awake, for 2 days. Then 1 drop, every 4 hours, while awake, for the next 5 days. 02/19/22  Yes Eusebio FriendlyEaves, Dorrene Bently B, PA-C  ?econazole nitrate 1 % cream Apply topically. 11/07/21  Yes [provider]  ?losartan (COZAAR) 100 MG tablet Take 100 mg by mouth daily. 11/28/21  Yes [provider]  ?metFORMIN (GLUCOPHAGE-XR) 500 MG 24 hr tablet Take by mouth. 10/16/21 10/17/22 Yes [provider]  ?Naproxen Sodium (ALEVE PO) Take by mouth.   Yes [provider]  ?silodosin (RAPAFLO) 8 MG CAPS capsule Take by mouth. 07/12/21  Yes [provider]  ?sitaGLIPtin (JANUVIA) 100 MG tablet Take 1 tablet by mouth daily. 10/16/21 10/16/22 Yes [provider]  ?solifenacin (VESICARE) 5 MG tablet Take by mouth. 07/12/21 07/12/22 Yes [provider]  ?albuterol (VENTOLIN HFA) 108 (90 Base) MCG/ACT inhaler Inhale 2 puffs into the lungs every 4 (four) hours as needed. 11/22/20   Becky Augustayan, Jeremy, NP  ?amLODipine (NORVASC) 10 MG tablet  06/15/20   [provider]  ?atorvastatin (LIPITOR) 10 MG tablet Take 10 mg by mouth daily. 06/26/20   [provider]  ?Capsaicin 0.1 % CREA Apply topically. 10/16/21   [provider]  ?clobetasol cream (TEMOVATE) 0.05 % Apply 1 application. topically 2 (  two) times daily. 10/16/21   [provider]  ?EPINEPHrine 0.3 mg/0.3 mL IJ SOAJ injection SMARTSIG:0.3 Milliliter(s) IM Once PRN 07/03/20   [provider]  ?fluconazole (DIFLUCAN) 150 MG tablet Take by mouth. 11/07/21   [provider]  ?glipiZIDE (GLUCOTROL XL) 10 MG 24 hr tablet Take by mouth. 06/27/16   [provider]  ?hydrocortisone 2.5 % cream Apply 2x a day to affected area (Hips) 08/06/17   [provider]  ?JANUVIA 100 MG tablet Take 100 mg by mouth daily.  02/05/22   [provider]  ?naproxen (NAPROSYN) 375 MG tablet Take 375 mg by mouth 2 (two) times daily. 04/23/20   [provider]  ?triamcinolone cream (KENALOG) 0.1 % Apply 1 application topically 2 (two) times daily. Apply for 2 weeks. May use on face 07/03/20   Domenick Gong, MD  ?famotidine (PEPCID) 20 MG tablet Take 1 tablet (20 mg total) by mouth 2 (two) times daily. 07/03/20 11/22/20  Domenick Gong, MD  ? ? ?Family History ?No family history on file. ? ?Social History ?Social History  ? ?Tobacco Use  ? Smoking status: Never  ? Smokeless tobacco: Never  ?Vaping Use  ? Vaping Use: Never used  ?Substance Use Topics  ? Alcohol use: No  ?  Alcohol/week: 0.0 standard drinks  ? Drug use: Never  ? ? ? ?Allergies   ?Bee venom ? ? ?Review of Systems ?Review of Systems  ?Constitutional:  Negative for fatigue and fever.  ?HENT:  Negative for congestion and facial swelling.   ?Eyes:  Positive for pain and redness. Negative for photophobia, discharge, itching and visual disturbance.  ?Respiratory:  Negative for cough.   ?Skin:  Negative for rash.  ?Neurological:  Negative for dizziness and headaches.  ? ? ?Physical Exam ?Triage Vital Signs ?ED Triage Vitals  ?Enc Vitals Group  ?   BP   ?   Pulse   ?   Resp   ?   Temp   ?   Temp src   ?   SpO2   ?   Weight   ?   Height   ?   Head Circumference   ?   Peak Flow   ?   Pain Score   ?   Pain Loc   ?   Pain Edu?   ?   Excl. in GC?   ? ?No data found. ? ?Updated Vital Signs ?BP 139/88 (BP Location: Left Arm)   Pulse 92   Temp 98.5 ?F (36.9 ?C) (Oral)   Resp 18   Ht 5\' 7"  (1.702 m)   Wt 213 lb (96.6 kg)   SpO2 97%   BMI 33.36 kg/m?  ? ?Visual Acuity ?Right Eye Distance: 20/30 Corrected ?Left Eye Distance: 20/25 Corrected ?Bilateral Distance: 20/25 Corrected ? ?Physical Exam ?Vitals and nursing note reviewed.  ?Constitutional:   ?   General: He is not in acute distress. ?   Appearance: Normal appearance. He is well-developed. He is not ill-appearing.   ?HENT:  ?   Head: Normocephalic and atraumatic.  ?   Nose: Nose normal.  ?Eyes:  ?   General: Lids are everted, no foreign bodies appreciated. No scleral icterus.    ?   Right eye: No discharge.     ?   Left eye: No discharge.  ?   Extraocular Movements: Extraocular movements intact.  ?   Conjunctiva/sclera:  ?   Right eye: Right conjunctiva is injected (slight injection).  ?  Pupils: Pupils are equal, round, and reactive to light.  ?   Comments: 2 small black specks ~9 o'clock with small abrasion seen in this area as well. Mild swelling and erythema right upper eyelid  ?Cardiovascular:  ?   Rate and Rhythm: Normal rate.  ?Pulmonary:  ?   Effort: Pulmonary effort is normal. No respiratory distress.  ?Musculoskeletal:  ?   Cervical back: Neck supple.  ?Skin: ?   General: Skin is warm and dry.  ?   Capillary Refill: Capillary refill takes less than 2 seconds.  ?Neurological:  ?   General: No focal deficit present.  ?   Mental Status: He is alert. Mental status is at baseline.  ?   Motor: No weakness.  ?   Gait: Gait normal.  ?Psychiatric:     ?   Mood and Affect: Mood normal.     ?   Behavior: Behavior normal.     ?   Thought Content: Thought content normal.  ? ? ? ?UC Treatments / Results  ?Labs ?(all labs ordered are listed, but only abnormal results are displayed) ?Labs Reviewed - No data to display ? ?EKG ? ? ?Radiology ?No results found. ? ?Procedures ?Procedures (including critical care time) ? ?Medications Ordered in UC ?Medications - No data to display ? ?Initial Impression / Assessment and Plan / UC Course  ?I have reviewed the triage vital signs and the nursing notes. ? ?Pertinent labs & imaging results that were available during my care of the patient were reviewed by me and considered in my medical decision making (see chart for details). ? ?66 year old male presenting for right eye pain and redness following grass mowing yesterday.  Patient says he thinks It in His Eye.  Also Reports Foreign Body  Sensation.  Vision is intact.  He has slight injection of the right conjunctivae.  There are 2 small black specks/foreign bodies of the cornea along with a small abrasion in this area.  Also mild swelling and erythema of the r

## 2022-02-19 NOTE — ED Triage Notes (Signed)
Patient is here for "right eye problem". Something "got in my right eye". Happened "yesterday evening". "No idea what it was". Causing pain, tears and irritation in eye.  ?

## 2022-02-19 NOTE — Discharge Instructions (Signed)
-  You had a couple small black dots of your cornea which I was able to flush out. ?- You have a small abrasion in this area.  This will heal on its own in a few days but as discussed, we often give antibiotic eyedrops started.  Infection.  I have sent Ciloxan to the pharmacy. ?- If you feel foreign body sensation again, may flush your eye with normal saline. ?- If you have change in vision you should be seen again. ?- You can ice your eye to help with swelling and discomfort. ?

## 2022-07-18 ENCOUNTER — Ambulatory Visit
Admission: EM | Admit: 2022-07-18 | Discharge: 2022-07-18 | Disposition: A | Discharge status: Home / Self Care | Payer: 59 | Attending: Emergency Medicine | Admitting: Emergency Medicine

## 2022-07-18 DIAGNOSIS — N3000 Acute cystitis without hematuria: Secondary | ICD-10-CM | POA: Insufficient documentation

## 2022-07-18 LAB — URINALYSIS, MICROSCOPIC (REFLEX): WBC, UA: >50 WBC/hpf (ref 0–5)

## 2022-07-18 LAB — URINALYSIS, ROUTINE W REFLEX MICROSCOPIC
Bilirubin Urine: NEGATIVE
Glucose, UA: 250 mg/dL — AB
Ketones, ur: NEGATIVE mg/dL
Nitrite: NEGATIVE
Protein, ur: 30 mg/dL — AB
Specific Gravity, Urine: 1.02 (ref 1.005–1.030)
pH: 7 (ref 5.0–8.0)

## 2022-07-18 MED ORDER — NITROFURANTOIN MONOHYD MACRO 100 MG PO CAPS: 100.0000 mg | ORAL_CAPSULE | Freq: Two times a day (BID) | ORAL | 0 refills | Status: AC

## 2022-07-18 MED ORDER — PHENAZOPYRIDINE HCL 200 MG PO TABS: 200.0000 mg | ORAL_TABLET | Freq: Three times a day (TID) | ORAL | 0 refills | Status: DC

## 2022-07-18 NOTE — ED Provider Notes (Signed)
[facility]    CSN: [id number] Arrival date & time: 07/18/22  1544      History   Chief Complaint Chief Complaint  Patient presents with   Urinary Tract Infection    HPI Mitchell Ortiz is a 66 y.o. male.   Patient presents with urinary frequency, urgency, dysuria and bilateral flank pain occurring intermittently for 2 days.  Attempted use of Aleve which has been ineffective.  History of BPH, diabetes, hypertension and kidney stones.  Denies hematuria, lower abdominal pain and pressure.    Past Medical History:  Diagnosis Date   Diabetes mellitus without complication (HCC)    Hypertension    Kidney stone    Kidney stones     Patient Active Problem List   Diagnosis Date Noted   Pelvic floor weakness, male 01/08/2022   Nocturia 01/08/2022   Mixed stress and urge urinary incontinence 01/08/2022   Elevated alkaline phosphatase level 03/21/2021   Other male erectile dysfunction 01/11/2021   Lichen simplex chronicus 10/26/2020   Benign prostatic hyperplasia with urinary frequency 07/26/2020   Umbilical hernia without obstruction and without gangrene 08/31/2019   Family history of heart disease in brother 07/25/2019   Hyperlipidemia 08/25/2018   Hypertension 02/25/2017   Type 2 diabetes mellitus without complication, without long-term current use of insulin (HCC) 08/21/2016   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 Authorizing Provider  aspirin 81 MG EC tablet Take by mouth. 05/26/16  Yes [provider]  atorvastatin (LIPITOR) 10 MG tablet Take 10 mg by mouth daily. 06/26/20  Yes [provider]  econazole nitrate 1 % cream Apply topically. 11/07/21  Yes [provider]  EPINEPHrine 0.3 mg/0.3 mL IJ SOAJ injection SMARTSIG:0.3 Milliliter(s) IM Once PRN 07/03/20  Yes [provider]  fluconazole (DIFLUCAN) 150 MG tablet Take by mouth. 11/07/21  Yes [provider]  glipiZIDE (GLUCOTROL XL) 10 MG 24 hr tablet Take by mouth. 06/27/16  Yes [provider]  hydrocortisone 2.5 % cream Apply 2x a day to affected area (Hips) 08/06/17  Yes [provider]  JANUVIA 100 MG tablet Take 100 mg by mouth daily. 02/05/22  Yes [provider]  losartan (COZAAR) 100 MG tablet Take 100 mg by mouth daily. 11/28/21  Yes [provider]  metFORMIN (GLUCOPHAGE-XR) 500 MG 24 hr tablet Take by mouth. 10/16/21 10/17/22 Yes [provider]  naproxen (NAPROSYN) 375 MG tablet Take 375 mg by mouth 2 (two) times daily. 04/23/20  Yes [provider]  Naproxen Sodium (ALEVE PO) Take by mouth.   Yes [provider]  sitaGLIPtin (JANUVIA) 100 MG tablet Take 1 tablet by mouth daily. 10/16/21 10/16/22 Yes [provider]  triamcinolone cream (KENALOG) 0.1 % Apply 1 application topically 2 (two) times daily. Apply for 2 weeks. May use on face 07/03/20  Yes Domenick Gong, MD  albuterol (VENTOLIN HFA) 108 (90 Base) MCG/ACT inhaler Inhale 2 puffs into the lungs every 4 (four) hours as needed. 11/22/20   Becky Augusta, NP  amLODipine (NORVASC) 10 MG tablet  06/15/20   [provider]  Capsaicin 0.1 % CREA Apply topically. 10/16/21   [provider]  ciprofloxacin (CILOXAN) 0.3 % ophthalmic solution Administer 1 drop, every 2 hours, while awake, for 2 days. Then 1 drop, every 4 hours, while awake, for the next 5 days. 02/19/22   Eusebio Friendly B, PA-C  clobetasol cream (TEMOVATE) 0.05 % Apply 1 application. topically 2 (two) times daily. 10/16/21   [provider]  silodosin (RAPAFLO) 8 MG CAPS capsule Take  by mouth. 07/12/21   [provider]  famotidine (PEPCID) 20 MG tablet Take 1 tablet (20 mg total) by mouth 2 (two) times daily. 07/03/20 11/22/20  Domenick Gong, MD    Family History History reviewed. No pertinent family history.  Social History Social History   Tobacco Use   Smoking status: Never   Smokeless tobacco: Never  Vaping Use   Vaping Use: Never used  Substance Use Topics   Alcohol use: No    Alcohol/week: 0.0 standard drinks of alcohol   Drug use: Never     Allergies   Bee venom   Review of Systems Review of Systems  Constitutional: Negative.   HENT: Negative.    Respiratory: Negative.    Cardiovascular: Negative.   Genitourinary:  Positive for dysuria, flank pain, frequency and urgency. Negative for decreased urine volume, difficulty urinating, enuresis, genital sores, hematuria, penile discharge, penile pain, penile swelling, scrotal swelling and testicular pain.     Physical Exam Triage Vital Signs ED Triage Vitals  Enc Vitals Group     BP 07/18/22 1602 (!) 146/92     Pulse Rate 07/18/22 1602 79     Resp 07/18/22 1602 18     Temp 07/18/22 1602 98.4 F (36.9 C)     Temp Source 07/18/22 1602 Oral     SpO2 07/18/22 1602 95 %     Weight 07/18/22 1559 215 lb (97.5 kg)     Height 07/18/22 1559 5\' 8"  (1.727 m)     Head Circumference --      Peak Flow --      Pain Score 07/18/22 1559 10     Pain Loc --      Pain Edu? --      Excl. in [place]? --    No data found.  Updated Vital Signs BP (!) 146/92 (BP Location: Left Arm)   Pulse 79   Temp 98.4 F (36.9 C) (Oral)   Resp 18   Ht 5\' 8"  (1.727 m)   Wt 215 lb (97.5 kg)   SpO2 95%   BMI 32.69 kg/m   Visual Acuity Right Eye Distance:   Left Eye Distance:   Bilateral Distance:    Right Eye Near:   Left Eye Near:    Bilateral Near:     Physical Exam Constitutional:      Appearance: Normal appearance.  Eyes:     Extraocular Movements: Extraocular movements intact.  Pulmonary:      Effort: Pulmonary effort is normal.  Abdominal:     General: Abdomen is flat. Bowel sounds are normal. There is no distension.     Palpations: Abdomen is soft.     Tenderness: There is no abdominal tenderness. There is no right CVA tenderness or left CVA tenderness.  Neurological:     Mental Status: He is alert and oriented to person, place, and time.      UC Treatments / Results  Labs (all labs ordered are listed, but only abnormal results are displayed) Labs Reviewed  URINALYSIS, ROUTINE  W REFLEX MICROSCOPIC    EKG   Radiology No results found.  Procedures Procedures (including critical care time)  Medications Ordered in UC Medications - No data to display  Initial Impression / Assessment and Plan / UC Course  I have reviewed the triage vital signs and the nursing notes.  Pertinent labs & imaging results that were available during my care of the patient were reviewed by me and considered in my medical decision making (see chart for details).  Acute cystitis without hematuria  Urinalysis showing leukocytes and bacteria under the microscope, sent for culture, discussed with patient, based on urine culture from April 2024 Klebsiella was present, shows antibiotic based on culture, Macrobid prescribed as well as Pyridium, recommended increase fluid intake and good hygiene, given strict precautions to follow-up if symptoms persist or worsen, advised patient that if he gets another urinary infection that he is to follow-up with his urologist for further evaluation Final Clinical Impressions(s) / UC Diagnoses   Final diagnoses:  None   Discharge Instructions   None    ED Prescriptions   None    PDMP not reviewed this encounter.   Hans Eden, NP 07/18/22 1708

## 2022-07-18 NOTE — ED Triage Notes (Addendum)
Pt c/o urinary burning x2days.  Pt believes he has a kidney infection  Pt denies worry of any STDs and denies any penile drainage.

## 2022-07-18 NOTE — Discharge Instructions (Addendum)
Your urinalysis shows Mitchell Ortiz blood cells and bacteria underneath the microscope which are indicative of infection, your urine will be sent to the lab to determine exactly which bacteria is present, if any changes need to be made to your medications you will be notified  Begin use of Macrobid  every morning and every evening for 7 days  You may use  Pyridium to help minimize your symptoms until antibiotic removes bacteria, this medication will turn your urine orange  Increase your fluid intake through use of water  If symptoms continue to persist after use of medication or recur please follow-up with urgent care or your primary doctor as needed

## 2022-07-20 LAB — URINE CULTURE

## 2022-08-11 ENCOUNTER — Encounter: Payer: Self-pay | Admitting: Podiatry

## 2022-08-11 ENCOUNTER — Ambulatory Visit (INDEPENDENT_AMBULATORY_CARE_PROVIDER_SITE_OTHER): Payer: 59 | Admitting: Podiatry

## 2022-08-11 DIAGNOSIS — M79674 Pain in right toe(s): Secondary | ICD-10-CM | POA: Diagnosis not present

## 2022-08-11 DIAGNOSIS — E119 Type 2 diabetes mellitus without complications: Secondary | ICD-10-CM | POA: Diagnosis not present

## 2022-08-11 DIAGNOSIS — B351 Tinea unguium: Secondary | ICD-10-CM | POA: Diagnosis not present

## 2022-08-11 DIAGNOSIS — M79675 Pain in left toe(s): Secondary | ICD-10-CM | POA: Diagnosis not present

## 2022-08-11 NOTE — Progress Notes (Signed)
This patient returns to my office for at risk foot care.  This patient requires this care by a professional since this patient will be at risk due to having diabetes.  This patient is unable to cut nails himself since the patient cannot reach his nails.These nails are painful walking and wearing shoes.  This patient presents for at risk foot care today.  General Appearance  Alert, conversant and in no acute stress.  Vascular  Dorsalis pedis and posterior tibial  pulses are palpable  bilaterally.  Capillary return is within normal limits  bilaterally. Temperature is within normal limits  bilaterally.  Neurologic  Senn-Weinstein monofilament wire test within normal limits  bilaterally. Muscle power within normal limits bilaterally.  Nails Thick disfigured discolored nails with subungual debris  hallux  bilaterally. No evidence of bacterial infection or drainage bilaterally.  Orthopedic  No limitations of motion  feet .  No crepitus or effusions noted.  No bony pathology or digital deformities noted.  Skin  normotropic skin with no porokeratosis noted bilaterally.  No signs of infections or ulcers noted.     Onychomycosis  Pain in right toes  Pain in left toes  Consent was obtained for treatment procedures.   Mechanical debridement of nails 1-5  bilaterally performed with a nail nipper.  Filed with dremel without incident.    Return office visit    3 months                 Told patient to return for periodic foot care and evaluation due to potential at risk complications.   Akaiya Touchette DPM   

## 2022-11-06 ENCOUNTER — Encounter: Payer: Self-pay | Admitting: Podiatry

## 2022-11-06 ENCOUNTER — Ambulatory Visit (INDEPENDENT_AMBULATORY_CARE_PROVIDER_SITE_OTHER): Payer: 59 | Admitting: Podiatry

## 2022-11-06 VITALS — BP 163/95 | HR 80

## 2022-11-06 DIAGNOSIS — E119 Type 2 diabetes mellitus without complications: Secondary | ICD-10-CM

## 2022-11-06 DIAGNOSIS — M79674 Pain in right toe(s): Secondary | ICD-10-CM

## 2022-11-06 DIAGNOSIS — B351 Tinea unguium: Secondary | ICD-10-CM | POA: Diagnosis not present

## 2022-11-06 DIAGNOSIS — M79675 Pain in left toe(s): Secondary | ICD-10-CM | POA: Diagnosis not present

## 2022-11-06 NOTE — Progress Notes (Signed)
This patient returns to my office for at risk foot care.  This patient requires this care by a professional since this patient will be at risk due to having diabetes.  This patient is unable to cut nails himself since the patient cannot reach his nails.These nails are painful walking and wearing shoes.  This patient presents for at risk foot care today.  General Appearance  Alert, conversant and in no acute stress.  Vascular  Dorsalis pedis and posterior tibial  pulses are palpable  bilaterally.  Capillary return is within normal limits  bilaterally. Temperature is within normal limits  bilaterally.  Neurologic  Senn-Weinstein monofilament wire test within normal limits  bilaterally. Muscle power within normal limits bilaterally.  Nails Thick disfigured discolored nails with subungual debris  hallux  bilaterally. No evidence of bacterial infection or drainage bilaterally.  Orthopedic  No limitations of motion  feet .  No crepitus or effusions noted.  No bony pathology or digital deformities noted.  Skin  normotropic skin with no porokeratosis noted bilaterally.  No signs of infections or ulcers noted.     Onychomycosis  Pain in right toes  Pain in left toes  Consent was obtained for treatment procedures.   Mechanical debridement of nails 1-5  bilaterally performed with a nail nipper.  Filed with dremel without incident.    Return office visit    3 months                 Told patient to return for periodic foot care and evaluation due to potential at risk complications.   Bannie Lobban DPM   

## 2022-12-16 ENCOUNTER — Other Ambulatory Visit: Payer: Self-pay

## 2022-12-16 ENCOUNTER — Ambulatory Visit
Admission: EM | Admit: 2022-12-16 | Discharge: 2022-12-16 | Disposition: A | Discharge status: Home / Self Care | Payer: 59 | Attending: Physician Assistant | Admitting: Physician Assistant

## 2022-12-16 DIAGNOSIS — Z9889 Other specified postprocedural states: Secondary | ICD-10-CM | POA: Insufficient documentation

## 2022-12-16 DIAGNOSIS — R310 Gross hematuria: Secondary | ICD-10-CM

## 2022-12-16 LAB — URINALYSIS, W/ REFLEX TO CULTURE (INFECTION SUSPECTED)

## 2022-12-16 NOTE — ED Triage Notes (Signed)
Had prostate surgery 11/24/22 pt states he had been bleeding since surgery and bleeding got worse yesterday.

## 2022-12-16 NOTE — Discharge Instructions (Signed)
-  The urine sample you left today just showed a lot of blood.  I am unable to tell if there is an infection or not.  We will culture the urine and call you if there is infection but I want you to reach back out to your urologist today and let them know that you had a large amount of blood seen in the urine and we recommend you follow back up with them for evaluation.  You may need to have a camera guided through to see if there are any injuries related to the procedure or to look at your bladder. - If the bleeding becomes much heavier or you experience fatigue or increased pain, go to ER.

## 2022-12-16 NOTE — ED Provider Notes (Signed)
[facility]    CSN: [id number] Arrival date & time: 12/16/22  [id number]      History   Chief Complaint No chief complaint on file.   HPI Mitchell Ortiz is a 67 y.o. male presenting for gross hematuria for the past 2 to 3 weeks.  Patient reports having clumps and clots of blood in his urine.  States he has to milk the clot out before he can urinate.  Reports some urinary frequency and occasional dysuria but says that nothing abnormal for him.  Patient has history of BPH and UTIs. He had a TURP procedure performed 3 weeks ago (11/24/2022). Follows up with [facility] in [place], [place].  He denies fever, fatigue, chills, flank pain, abdominal pain, nausea/vomiting.  Denies penile or testicular pain or swelling.  HPI  Past Medical History:  Diagnosis Date   Diabetes mellitus without complication ([place])    Hypertension    Kidney stone    Kidney stones     Patient Active Problem List   Diagnosis Date Noted   Pain due to onychomycosis of toenails of both feet 08/11/2022   Pelvic floor weakness, male 01/08/2022   Nocturia 01/08/2022   Mixed stress and urge urinary incontinence 01/08/2022   Elevated alkaline phosphatase level 03/21/2021   Other male erectile dysfunction [id number]   Lichen simplex chronicus 10/26/2020   Benign prostatic hyperplasia with urinary frequency [id number]   Umbilical hernia without obstruction and without gangrene 08/31/2019   Family history of heart disease in brother 07/25/2019   Hyperlipidemia 08/25/2018   Hypertension 02/25/2017   Type 2 diabetes mellitus without complication, without long-term current use of insulin ([place]) 08/21/2016   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 Authorizing Provider  albuterol (VENTOLIN HFA) 108 (90 Base) MCG/ACT inhaler Inhale 2 puffs into the lungs every 4 (four) hours as needed. Patient not taking: Reported on 11/06/2022 11/22/20   Margarette Canada, NP  amLODipine (NORVASC) 10 MG tablet  06/15/20   [provider]  aspirin 81 MG EC tablet Take by mouth. 05/26/16   [provider]  atorvastatin (LIPITOR) 10 MG tablet Take 10 mg by mouth daily. 06/26/20   [provider]  Capsaicin 0.1 % CREA Apply topically. 10/16/21   [provider]  ciprofloxacin (CILOXAN) 0.3 % ophthalmic solution Administer 1 drop, every 2 hours, while awake, for 2 days. Then 1 drop, every 4 hours, while awake, for the next 5 days. Patient not taking: Reported on 11/06/2022 02/19/22   Laurene Footman B, PA-C  clobetasol cream (TEMOVATE) [id number] % Apply 1 application. topically 2 (two) times daily. 10/16/21   [provider]  econazole nitrate 1 % cream Apply topically. 11/07/21   [provider]  EPINEPHrine 0.3 mg/0.3 mL IJ SOAJ injection SMARTSIG:0.3 Milliliter(s) IM Once PRN 07/03/20   [provider]  fluconazole (DIFLUCAN) 150 MG tablet Take by mouth. 11/07/21   [provider]  glipiZIDE (GLUCOTROL XL) 10 MG 24 hr tablet Take by mouth. 06/27/16   [provider]  hydrocortisone 2.5 % cream Apply  2x a day to affected area (Hips) 08/06/17   [provider]  JANUVIA 100 MG tablet Take 100 mg by mouth daily. 02/05/22   [provider]  losartan (COZAAR) 100 MG tablet Take 100 mg by mouth daily. 11/28/21   [provider]  metFORMIN (GLUCOPHAGE-XR) 500 MG 24 hr tablet Take by mouth. 10/16/21 10/17/22  [provider]  naproxen (NAPROSYN) 375 MG tablet Take 375 mg by mouth 2 (two) times daily. 04/23/20   [provider]  Naproxen Sodium  (ALEVE PO) Take by mouth.    [provider]  phenazopyridine (PYRIDIUM) 200 MG tablet Take 1 tablet (200 mg total) by mouth 3 (three) times daily. 07/18/22   Hans Eden, NP  silodosin (RAPAFLO) 8 MG CAPS capsule Take by mouth. 07/12/21   [provider]  sitaGLIPtin (JANUVIA) 100 MG tablet Take 1 tablet by mouth daily. 10/16/21 10/16/22  [provider]  triamcinolone cream (KENALOG) 0.1 % Apply 1 application topically 2 (two) times daily. Apply for 2 weeks. May use on face 07/03/20   Melynda Ripple, MD  famotidine (PEPCID) 20 MG tablet Take 1 tablet (20 mg total) by mouth 2 (two) times daily. 07/03/20 11/22/20  Melynda Ripple, MD    Family History History reviewed. No pertinent family history.  Social History Social History   Tobacco Use   Smoking status: Never   Smokeless tobacco: Never  Vaping Use   Vaping Use: Never used  Substance Use Topics   Alcohol use: No    Alcohol/week: 0.0 standard drinks of alcohol   Drug use: Never     Allergies   Bee venom   Review of Systems Review of Systems  Constitutional:  Negative for fatigue and fever.  Gastrointestinal:  Negative for abdominal pain, nausea and vomiting.  Genitourinary:  Positive for dysuria, frequency and hematuria. Negative for difficulty urinating, flank pain, penile discharge, penile pain, penile swelling, scrotal swelling, testicular pain and urgency.  Musculoskeletal:  Negative for back pain.  Skin:  Negative for color change and wound.  Neurological:  Negative for weakness.     Physical Exam Triage Vital Signs ED Triage Vitals  Enc Vitals Group     BP      Pulse      Resp      Temp      Temp src      SpO2      Weight      Height      Head Circumference      Peak Flow      Pain Score      Pain Loc      Pain Edu?      Excl. in [place]?    No data found.  Updated Vital Signs BP (!) 148/91   Pulse 98   Temp 98.7 F (37.1 C)   Resp 20   SpO2 99%     Physical  Exam Vitals and nursing note reviewed.  Constitutional:      General: He is not in acute distress.    Appearance: Normal appearance. He is well-developed. He is not ill-appearing.  HENT:     Head: Normocephalic and atraumatic.  Eyes:     Conjunctiva/sclera: Conjunctivae normal.  Cardiovascular:     Rate and Rhythm: Normal rate and regular rhythm.     Heart sounds: Normal heart sounds.  Pulmonary:     Effort: Pulmonary effort is normal. No respiratory distress.     Breath sounds: Normal breath  sounds.  Abdominal:     Palpations: Abdomen is soft.     Tenderness: There is no abdominal tenderness. There is no right CVA tenderness or left CVA tenderness.  Musculoskeletal:     Cervical back: Neck supple.  Skin:    General: Skin is warm and dry.     Capillary Refill: Capillary refill takes less than 2 seconds.  Neurological:     General: No focal deficit present.     Mental Status: He is alert. Mental status is at baseline.     Motor: No weakness.     Gait: Gait normal.  Psychiatric:        Mood and Affect: Mood normal.      UC Treatments / Results  Labs (all labs ordered are listed, but only abnormal results are displayed) Labs Reviewed  URINALYSIS, W/ REFLEX TO CULTURE (INFECTION SUSPECTED) - Abnormal; Notable for the following components:      Result Value   Color, Urine BROWN (*)    APPearance CLOUDY (*)    Glucose, UA   (*)    Value: TEST NOT REPORTED DUE TO COLOR INTERFERENCE OF URINE PIGMENT   Hgb urine dipstick   (*)    Value: TEST NOT REPORTED DUE TO COLOR INTERFERENCE OF URINE PIGMENT   Bilirubin Urine   (*)    Value: TEST NOT REPORTED DUE TO COLOR INTERFERENCE OF URINE PIGMENT   Ketones, ur   (*)    Value: TEST NOT REPORTED DUE TO COLOR INTERFERENCE OF URINE PIGMENT   Protein, ur   (*)    Value: TEST NOT REPORTED DUE TO COLOR INTERFERENCE OF URINE PIGMENT   Nitrite   (*)    Value: TEST NOT REPORTED DUE TO COLOR INTERFERENCE OF URINE PIGMENT   Leukocytes,Ua    (*)    Value: TEST NOT REPORTED DUE TO COLOR INTERFERENCE OF URINE PIGMENT   Bacteria, UA FIELD OBSCURED BY RBC'S (*)    All other components within normal limits  URINE CULTURE    EKG   Radiology No results found.  Procedures Procedures (including critical care time)  Medications Ordered in UC Medications - No data to display  Initial Impression / Assessment and Plan / UC Course  I have reviewed the triage vital signs and the nursing notes.  Pertinent labs & imaging results that were available during my care of the patient were reviewed by me and considered in my medical decision making (see chart for details).   67 year old male with history of BPH presents for persistent gross hematuria with clots and clots of blood for the past 3 weeks status post TURP.  Also reports dysuria and frequency but this is not new.  Denies fever, chills, flank pain, abdominal pain, penile pain.  No nausea/vomiting or chills.  He is afebrile and overall well-appearing.  On exam abdomen is soft and nontender and he has no CVA tenderness.  Urinalysis obtained today shows brown urine with cloudy appearance and large amount of blood.  Urine to be sent for culture as I am unable to tell if there is an infection or not due to the large amount of blood.  I am concerned about the symptoms being status post TURP.  Unsure if this is due to injury or if he potentially has underlying bladder cancer.  We also discussed possibility of kidney stones but he does not have any flank pain and has not had any abdominal pain, nausea, chills, vomiting.  I have advised him to  contact his urologist immediately for an appointment.  He did contact them earlier today to inform them of what was going on.  They advised him that they could see him or PCP.  Advised him to request an appointment to see them.  He may need to have scoping procedure to evaluate for injury or bladder cystoscopy, possible CT scan to assess for kidney stone.   Advised him we are unable to provide those modalities in urgent care.  Patient to contact urologist ASAP.  Discussed ED precautions.   Final Clinical Impressions(s) / UC Diagnoses   Final diagnoses:  Persistent gross hematuria  S/P urological surgery     Discharge Instructions      -The urine sample you left today just showed a lot of blood.  I am unable to tell if there is an infection or not.  We will culture the urine and call you if there is infection but I want you to reach back out to your urologist today and let them know that you had a large amount of blood seen in the urine and we recommend you follow back up with them for evaluation.  You may need to have a camera guided through to see if there are any injuries related to the procedure or to look at your bladder. - If the bleeding becomes much heavier or you experience fatigue or increased pain, go to ER.     ED Prescriptions   None    PDMP not reviewed this encounter.   Danton Clap, PA-C 12/16/22 1144

## 2022-12-16 NOTE — ED Triage Notes (Signed)
Had prostate surgery 11/24/22 pt states he has been bleeding since surgery and bleeding got worse yesterday. Also c/o urinary frequency and incontinence

## 2022-12-18 LAB — URINE CULTURE: Culture: >=100000 — AB

## 2022-12-19 ENCOUNTER — Telehealth: Payer: Self-pay

## 2022-12-19 MED ORDER — CEPHALEXIN 500 MG PO CAPS: 500.0000 mg | ORAL_CAPSULE | Freq: Two times a day (BID) | ORAL | 0 refills | Status: AC

## 2022-12-24 ENCOUNTER — Telehealth (HOSPITAL_COMMUNITY): Payer: Self-pay | Admitting: Emergency Medicine

## 2022-12-24 NOTE — Telephone Encounter (Signed)
Patient returned call from visit 12/16/22.  Reviewed results, he has had follow up since, and had f/u with PCP tomorrow.  No questions at this time

## 2023-02-05 ENCOUNTER — Ambulatory Visit (INDEPENDENT_AMBULATORY_CARE_PROVIDER_SITE_OTHER): Payer: 59 | Admitting: Podiatry

## 2023-02-05 ENCOUNTER — Encounter: Payer: Self-pay | Admitting: Podiatry

## 2023-02-05 DIAGNOSIS — M79674 Pain in right toe(s): Secondary | ICD-10-CM | POA: Diagnosis not present

## 2023-02-05 DIAGNOSIS — M79675 Pain in left toe(s): Secondary | ICD-10-CM | POA: Diagnosis not present

## 2023-02-05 DIAGNOSIS — B351 Tinea unguium: Secondary | ICD-10-CM

## 2023-02-05 DIAGNOSIS — E119 Type 2 diabetes mellitus without complications: Secondary | ICD-10-CM | POA: Diagnosis not present

## 2023-02-05 NOTE — Progress Notes (Signed)
This patient returns to my office for at risk foot care.  This patient requires this care by a professional since this patient will be at risk due to having diabetes.  This patient is unable to cut nails himself since the patient cannot reach his nails.These nails are painful walking and wearing shoes.  This patient presents for at risk foot care today.  General Appearance  Alert, conversant and in no acute stress.  Vascular  Dorsalis pedis and posterior tibial  pulses are palpable  bilaterally.  Capillary return is within normal limits  bilaterally. Temperature is within normal limits  bilaterally.  Neurologic  Senn-Weinstein monofilament wire test within normal limits  bilaterally. Muscle power within normal limits bilaterally.  Nails Thick disfigured discolored nails with subungual debris  hallux  bilaterally. No evidence of bacterial infection or drainage bilaterally.  Orthopedic  No limitations of motion  feet .  No crepitus or effusions noted.  No bony pathology or digital deformities noted.  Skin  normotropic skin with no porokeratosis noted bilaterally.  No signs of infections or ulcers noted.     Onychomycosis  Pain in right toes  Pain in left toes  Consent was obtained for treatment procedures.   Mechanical debridement of nails 1-5  bilaterally performed with a nail nipper.  Filed with dremel without incident.    Return office visit   3 months                  Told patient to return for periodic foot care and evaluation due to potential at risk complications.   Shawntia Mangal DPM   

## 2023-08-03 ENCOUNTER — Ambulatory Visit (INDEPENDENT_AMBULATORY_CARE_PROVIDER_SITE_OTHER): Payer: 59 | Admitting: Podiatry

## 2023-08-03 DIAGNOSIS — Z91199 Patient's noncompliance with other medical treatment and regimen due to unspecified reason: Secondary | ICD-10-CM

## 2023-08-03 NOTE — Progress Notes (Signed)
1. No-show for appointment

## 2023-12-31 ENCOUNTER — Ambulatory Visit
Admission: EM | Admit: 2023-12-31 | Discharge: 2023-12-31 | Disposition: A | Discharge status: Home / Self Care | Attending: Family Medicine | Admitting: Family Medicine

## 2023-12-31 DIAGNOSIS — R300 Dysuria: Secondary | ICD-10-CM | POA: Diagnosis present

## 2023-12-31 DIAGNOSIS — K5900 Constipation, unspecified: Secondary | ICD-10-CM | POA: Diagnosis not present

## 2023-12-31 DIAGNOSIS — M545 Low back pain, unspecified: Secondary | ICD-10-CM | POA: Insufficient documentation

## 2023-12-31 LAB — URINALYSIS, W/ REFLEX TO CULTURE (INFECTION SUSPECTED)
Bilirubin Urine: NEGATIVE
Glucose, UA: >=500 mg/dL — AB
Hgb urine dipstick: NEGATIVE
Ketones, ur: NEGATIVE mg/dL
Leukocytes,Ua: NEGATIVE
Nitrite: NEGATIVE
Protein, ur: NEGATIVE mg/dL
Specific Gravity, Urine: 1.02 (ref 1.005–1.030)
pH: 5.5 (ref 5.0–8.0)

## 2023-12-31 NOTE — Discharge Instructions (Signed)
 The clinic will contact you with results of the urine culture done today if positive.  Increase your fluid intake and monitor your blood sugars as that can also cause pain when you pee.  You may take over-the-counter MiraLAX as needed for your constipation.  You may do Tylenol for your low back pain and heat.  Follow-up with your PCP in 2 days for recheck.  Please go to the ER for any worsening symptoms.  Hope you feel better soon!

## 2023-12-31 NOTE — ED Provider Notes (Signed)
 [facility]    CSN: [id number] Arrival date & time: 12/31/23  1054      History   Chief Complaint Chief Complaint  Patient presents with   Urinary Tract Infection    HPI Mitchell Ortiz is a 68 y.o. male presents for dysuria.  Patient reports 2 weeks of urinary burning without urgency frequency or hematuria.  Does state there is a small odor to it as well.  He denies any fevers, nausea/vomiting, flank pain.  Does report some intermittent low back pain that does not radiate.  States he had prostate removed as well as circumcision 6 months ago and ever since then he seems to have urinary issues.  In addition he reports he has had some constipation since the surgery.  His last bowel movement was 2 days ago.  He has not attempted any over-the-counter treatments for this or urinary symptoms.  Denies any penile discharge, testicular pain or swelling, or STD concern or exposure.  No other concerns at this time.   Urinary Tract Infection Presenting symptoms: dysuria     Past Medical History:  Diagnosis Date   Diabetes mellitus without complication (HCC)    Hypertension    Kidney stone    Kidney stones     Patient Active Problem List   Diagnosis Date Noted   Pain due to onychomycosis of toenails of both feet 08/11/2022   Pelvic floor weakness, male 01/08/2022   Nocturia 01/08/2022   Mixed stress and urge urinary incontinence 01/08/2022   Elevated alkaline phosphatase level 03/21/2021   Other male erectile dysfunction 01/11/2021   Lichen simplex chronicus 10/26/2020   Benign prostatic hyperplasia with urinary frequency 07/26/2020   Umbilical hernia without obstruction and without gangrene 08/31/2019   Family history of heart disease in brother 07/25/2019   Hyperlipidemia 08/25/2018   Hypertension 02/25/2017   Type 2 diabetes mellitus without complication, without long-term current use of insulin (HCC) 08/21/2016   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 Authorizing Provider  albuterol (VENTOLIN HFA) 108 (90 Base) MCG/ACT inhaler Inhale 2 puffs into the lungs every 4 (four) hours as needed. 11/22/20  Yes Becky Augusta, NP  amLODipine (NORVASC) 10 MG tablet  06/15/20  Yes [provider]  aspirin 81 MG EC tablet Take by mouth. 05/26/16  Yes [provider]  atorvastatin (LIPITOR) 10 MG tablet Take 10 mg by mouth daily. 06/26/20  Yes [provider]  Capsaicin 0.1 % CREA Apply topically. 10/16/21  Yes [provider]  ciprofloxacin (CILOXAN) 0.3 % ophthalmic solution Administer 1 drop, every 2 hours, while awake, for 2 days. Then 1 drop, every 4 hours, while awake, for the next 5 days. 02/19/22  Yes Eusebio Friendly B, PA-C  econazole nitrate 1 % cream Apply topically. 11/07/21  Yes [provider]  EPINEPHrine 0.3 mg/0.3 mL IJ SOAJ injection SMARTSIG:0.3 Milliliter(s) IM Once PRN 07/03/20  Yes [provider]  glipiZIDE (GLUCOTROL XL) 10 MG 24 hr tablet Take by mouth. 06/27/16  Yes [provider]  JANUVIA 100 MG tablet Take  100 mg by mouth daily. 02/05/22  Yes [provider]  losartan (COZAAR) 100 MG tablet Take 100 mg by mouth daily. 11/28/21  Yes [provider]  naproxen (NAPROSYN) 375 MG tablet Take 375 mg by mouth 2 (two) times daily. 04/23/20  Yes [provider]  Naproxen Sodium (ALEVE PO) Take by mouth.   Yes [provider]  clobetasol cream (TEMOVATE) 0.05 % Apply 1 application. topically 2 (two) times daily. 10/16/21   [provider]  fluconazole (DIFLUCAN) 150 MG  tablet Take by mouth. 11/07/21   [provider]  hydrocortisone 2.5 % cream Apply 2x a day to affected area (Hips) 08/06/17   [provider]  metFORMIN (GLUCOPHAGE-XR) 500 MG 24 hr tablet Take by mouth. 10/16/21 10/17/22  [provider]  phenazopyridine (PYRIDIUM) 200 MG tablet Take 1 tablet (200 mg total) by mouth 3 (three) times daily. 07/18/22   Valinda Hoar, NP  silodosin (RAPAFLO) 8 MG CAPS capsule Take by mouth. 07/12/21   [provider]  sitaGLIPtin (JANUVIA) 100 MG tablet Take 1 tablet by mouth daily. 10/16/21 10/16/22  [provider]  triamcinolone cream (KENALOG) 0.1 % Apply 1 application topically 2 (two) times daily. Apply for 2 weeks. May use on face 07/03/20   Domenick Gong, MD  famotidine (PEPCID) 20 MG tablet Take 1 tablet (20 mg total) by mouth 2 (two) times daily. 07/03/20 11/22/20  Domenick Gong, MD    Family History History reviewed. No pertinent family history.  Social History Social History   Tobacco Use   Smoking status: Never   Smokeless tobacco: Never  Vaping Use   Vaping status: Never Used  Substance Use Topics   Alcohol use: No    Alcohol/week: 0.0 standard drinks of alcohol   Drug use: Never     Allergies   Bee venom   Review of Systems Review of Systems  Gastrointestinal:  Positive for constipation.  Genitourinary:  Positive for dysuria.  Musculoskeletal:  Positive for back pain.     Physical Exam Triage Vital Signs ED Triage Vitals  Encounter Vitals Group     BP 12/31/23 1208 (P) 128/86     Systolic BP Percentile --      Diastolic BP Percentile --      Pulse Rate 12/31/23 1208 (P) 66     Resp --      Temp 12/31/23 1208 (P) 98.5 F (36.9 C)     Temp Source 12/31/23 1208 (P) Oral     SpO2 12/31/23 1208 (P) 97 %     Weight 12/31/23 1204 204 lb (92.5 kg)     Height 12/31/23 1204 5\' 7"  (1.702 m)     Head Circumference --      Peak Flow --      Pain Score 12/31/23 1203 10     Pain  Loc --      Pain Education --      Exclude from Growth Chart --    No data found.  Updated Vital Signs BP (P) 128/86 (BP Location: Left Arm)   Pulse (P) 66   Temp (P) 98.5 F (36.9 C) (Oral)   Ht 5\' 7"  (1.702 m)   Wt 204 lb (92.5 kg)   SpO2 (P) 97%   BMI 31.95 kg/m   Visual Acuity Right Eye Distance:   Left Eye Distance:   Bilateral Distance:    Right Eye Near:   Left Eye Near:    Bilateral Near:  Physical Exam Vitals and nursing note reviewed.  Constitutional:      General: He is not in acute distress.    Appearance: Normal appearance. He is not ill-appearing, toxic-appearing or diaphoretic.  HENT:     Head: Normocephalic and atraumatic.  Eyes:     Pupils: Pupils are equal, round, and reactive to light.  Cardiovascular:     Rate and Rhythm: Normal rate.  Pulmonary:     Effort: Pulmonary effort is normal.  Abdominal:     Tenderness: There is no right CVA tenderness or left CVA tenderness.  Musculoskeletal:     Lumbar back: Normal. No swelling, deformity, lacerations, spasms, tenderness or bony tenderness.  Skin:    General: Skin is warm and dry.  Neurological:     General: No focal deficit present.     Mental Status: He is alert and oriented to person, place, and time.  Psychiatric:        Mood and Affect: Mood normal.        Behavior: Behavior normal.      UC Treatments / Results  Labs (all labs ordered are listed, but only abnormal results are displayed) Labs Reviewed  URINALYSIS, W/ REFLEX TO CULTURE (INFECTION SUSPECTED) - Abnormal; Notable for the following components:      Result Value   APPearance HAZY (*)    Glucose, UA >=500 (*)    Bacteria, UA FEW (*)    All other components within normal limits  URINE CULTURE    EKG   Radiology No results found.  Procedures Procedures (including critical care time)  Medications Ordered in UC Medications - No data to display  Initial Impression / Assessment and Plan / UC Course  I have  reviewed the triage vital signs and the nursing notes.  Pertinent labs & imaging results that were available during my care of the patient were reviewed by me and considered in my medical decision making (see chart for details).     Reviewed exam and symptoms with patient.  No red flags.  Urine with few bacteria otherwise no other signs of UTI, will send urine culture given symptoms.  Will contact for any positive results.  Discussed blood sugar as a cause of his dysuria.  He does not check his blood sugars daily and only takes metformin.  Last A1c was over 8.  Advised OTC MiraLAX for constipation.  Advised OTC Tylenol for low back pain.  Advised PCP follow-up 2 days for recheck.  ER precautions reviewed and patient verbalized understanding. Final Clinical Impressions(s) / UC Diagnoses   Final diagnoses:  Dysuria  Constipation, unspecified constipation type  Acute bilateral low back pain without sciatica     Discharge Instructions      The clinic will contact you with results of the urine culture done today if positive.  Increase your fluid intake and monitor your blood sugars as that can also cause pain when you pee.  You may take over-the-counter MiraLAX as needed for your constipation.  You may do Tylenol for your low back pain and heat.  Follow-up with your PCP in 2 days for recheck.  Please go to the ER for any worsening symptoms.  Hope you feel better soon!     ED Prescriptions   None    PDMP not reviewed this encounter.   Radford Pax, NP 12/31/23 1254

## 2023-12-31 NOTE — ED Triage Notes (Signed)
 Pt c/o left flank pain, burning while urinating x2weeks  Pt states that he is having left and right lower back tightness when sitting.   Pt states that he had prostate surgery and circumcision 6 months ago  Pt states that he has had very hard stools since the surgery that is painful to pass. Pt states his last bowel movement was Tuesday.

## 2024-01-01 LAB — URINE CULTURE: Culture: NO GROWTH

## 2024-05-24 LAB — COLOGUARD: COLOGUARD: NEGATIVE

## 2024-07-31 ENCOUNTER — Ambulatory Visit: Admission: EM | Admit: 2024-07-31 | Discharge: 2024-07-31 | Disposition: A | Discharge status: Home / Self Care

## 2024-07-31 ENCOUNTER — Ambulatory Visit (INDEPENDENT_AMBULATORY_CARE_PROVIDER_SITE_OTHER)

## 2024-07-31 ENCOUNTER — Ambulatory Visit: Payer: Self-pay | Admitting: Physician Assistant

## 2024-07-31 DIAGNOSIS — R051 Acute cough: Secondary | ICD-10-CM

## 2024-07-31 DIAGNOSIS — R0602 Shortness of breath: Secondary | ICD-10-CM | POA: Diagnosis not present

## 2024-07-31 DIAGNOSIS — R5383 Other fatigue: Secondary | ICD-10-CM

## 2024-07-31 DIAGNOSIS — J180 Bronchopneumonia, unspecified organism: Secondary | ICD-10-CM

## 2024-07-31 MED ORDER — PREDNISONE 20 MG PO TABS: 40.0000 mg | ORAL_TABLET | Freq: Every day | ORAL | 0 refills | Status: AC

## 2024-07-31 MED ORDER — DOXYCYCLINE HYCLATE 100 MG PO CAPS: 100.0000 mg | ORAL_CAPSULE | Freq: Two times a day (BID) | ORAL | 0 refills | Status: DC

## 2024-07-31 MED ORDER — PROMETHAZINE-DM 6.25-15 MG/5ML PO SYRP: 5.0000 mL | ORAL_SOLUTION | Freq: Four times a day (QID) | ORAL | 0 refills | Status: DC | PRN

## 2024-07-31 MED ORDER — DOXYCYCLINE HYCLATE 100 MG PO CAPS: 100.0000 mg | ORAL_CAPSULE | Freq: Two times a day (BID) | ORAL | 0 refills | Status: AC

## 2024-07-31 MED ORDER — PREDNISONE 20 MG PO TABS: 40.0000 mg | ORAL_TABLET | Freq: Every day | ORAL | 0 refills | Status: DC

## 2024-07-31 NOTE — ED Provider Notes (Signed)
 [facility]    CSN: [id number] Arrival date & time: 07/31/24  1040      History   Chief Complaint Chief Complaint  Patient presents with   Cough   Nasal Congestion   Fatigue    HPI Mitchell Ortiz is a 68 y.o. male presents for 2 week history of cough, congestion, fatigue and shortness of breath. Denies fever, sore throat, nasal congestion, chest pain, abdominal pain, vomiting and diarrhea.  Patient says symptoms are progressively worsening.  He has taken OTC meds.  She denies history of COPD or asthma.  Patient also denies ever being a smoker.  He does have a history of diabetes, hypertension and hyperlipidemia.  HPI  Past Medical History:  Diagnosis Date   Diabetes mellitus without complication (HCC)    Hypertension    Kidney stone    Kidney stones     Patient Active Problem List   Diagnosis Date Noted   Pain due to onychomycosis of toenails of both feet 08/11/2022   Pelvic floor weakness, male 01/08/2022   Nocturia 01/08/2022   Mixed stress and urge urinary incontinence 01/08/2022   Elevated alkaline phosphatase level 03/21/2021   Other male erectile dysfunction 01/11/2021   Lichen simplex chronicus 10/26/2020   Benign prostatic hyperplasia with urinary frequency 07/26/2020   Umbilical hernia without obstruction and without gangrene 08/31/2019   Family history of heart disease in brother 07/25/2019   Hyperlipidemia 08/25/2018   Hypertension 02/25/2017   Type 2 diabetes mellitus without complication, without long-term current use of insulin (HCC) 08/21/2016   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 Authorizing Provider  OZEMPIC, 1 MG/DOSE, 4 MG/3ML SOPN INJECT SUBCUTANEOUSLY 1 MG EVERY WEEK 04/28/24  Yes [provider]  albuterol  (VENTOLIN  HFA) 108 (90 Base) MCG/ACT inhaler Inhale 2 puffs into the lungs every 4 (four) hours as needed. 11/22/20   Bernardino Ditch, NP  amLODipine (NORVASC) 10 MG tablet  06/15/20   [provider]  aspirin 81 MG EC tablet Take by mouth. 05/26/16   [provider]  atorvastatin (LIPITOR) 10 MG tablet Take 10 mg by mouth daily. 06/26/20   [provider]  Capsaicin 0.1 % CREA Apply topically. 10/16/21   [provider]  ciprofloxacin  (CILOXAN ) 0.3 % ophthalmic solution Administer 1 drop, every 2 hours, while awake, for 2 days. Then 1 drop, every 4 hours, while awake, for the next 5 days. 02/19/22   Arvis Huxley B, PA-C  clobetasol cream (TEMOVATE) 0.05 % Apply 1 application. topically 2 (two) times daily. 10/16/21   [provider]  doxycycline (VIBRAMYCIN) 100 MG capsule Take 1 capsule (100 mg total) by mouth 2 (two) times daily for 7 days. 07/31/24 08/07/24  Arvis Huxley B, PA-C  econazole nitrate 1 % cream Apply topically. 11/07/21   [provider]  EPINEPHrine  0.3 mg/0.3 mL IJ SOAJ injection SMARTSIG:0.3 Milliliter(s) IM Once PRN 07/03/20   [provider]  fluconazole (DIFLUCAN) 150 MG tablet Take by mouth. 11/07/21   [provider]  glipiZIDE (GLUCOTROL XL) 10 MG 24  hr tablet Take by mouth. 06/27/16   [provider]  hydrocortisone 2.5 % cream Apply 2x a day to affected area (Hips) 08/06/17   [provider]  JANUVIA 100 MG tablet Take 100 mg by mouth daily. 02/05/22   [provider]  losartan (COZAAR) 100 MG tablet Take 100 mg by mouth daily. 11/28/21   [provider]  metFORMIN (GLUCOPHAGE-XR) 500 MG 24 hr tablet Take by mouth. 10/16/21 10/17/22  [provider]  naproxen (NAPROSYN) 375 MG tablet Take  375 mg by mouth 2 (two) times daily. 04/23/20   [provider]  Naproxen Sodium (ALEVE PO) Take by mouth.    [provider]  phenazopyridine  (PYRIDIUM ) 200 MG tablet Take 1 tablet (200 mg total) by mouth 3 (three) times daily. 07/18/22   White, Shelba SAUNDERS, NP  predniSONE  (DELTASONE ) 20 MG tablet Take 2 tablets (40 mg total) by mouth daily for 5 days. 07/31/24 08/05/24  Arvis Huxley B, PA-C  promethazine -dextromethorphan (PROMETHAZINE -DM) 6.25-15 MG/5ML syrup Take 5 mLs by mouth 4 (four) times daily as needed. 07/31/24   Arvis Huxley NOVAK, PA-C  silodosin (RAPAFLO) 8 MG CAPS capsule Take by mouth. 07/12/21   [provider]  sitaGLIPtin (JANUVIA) 100 MG tablet Take 1 tablet by mouth daily. 10/16/21 10/16/22  [provider]  triamcinolone  cream (KENALOG ) 0.1 % Apply 1 application topically 2 (two) times daily. Apply for 2 weeks. May use on face 07/03/20   Mortenson, Ashley, MD  famotidine  (PEPCID ) 20 MG tablet Take 1 tablet (20 mg total) by mouth 2 (two) times daily. 07/03/20 11/22/20  Van Knee, MD    Family History History reviewed. No pertinent family history.  Social History Social History   Tobacco Use   Smoking status: Never   Smokeless tobacco: Never  Vaping Use   Vaping status: Never Used  Substance Use Topics   Alcohol use: No    Alcohol/week: 0.0 standard drinks of alcohol   Drug use: Never     Allergies   Bee venom   Review of Systems Review of Systems  Constitutional:  Positive for fatigue. Negative for fever.  HENT:  Positive for congestion. Negative for rhinorrhea, sinus pressure, sinus pain and sore throat.   Respiratory:  Positive for cough and shortness of breath.   Cardiovascular:  Negative for chest pain.  Gastrointestinal:  Negative for abdominal pain, diarrhea, nausea and vomiting.  Musculoskeletal:  Negative for myalgias.  Neurological:  Negative for weakness, light-headedness and headaches.  Hematological:  Negative  for adenopathy.     Physical Exam Triage Vital Signs ED Triage Vitals  Encounter Vitals Group     BP      Girls Systolic BP Percentile      Girls Diastolic BP Percentile      Boys Systolic BP Percentile      Boys Diastolic BP Percentile      Pulse      Resp      Temp      Temp src      SpO2      Weight      Height      Head Circumference      Peak Flow      Pain Score      Pain Loc      Pain Education      Exclude from Growth Chart    No data found.  Updated Vital Signs BP (!) 126/90 (BP Location: Left Arm)   Pulse 79  Temp 98 F (36.7 C) (Oral)   Wt 208 lb 12.8 oz (94.7 kg)   SpO2 94%   BMI 32.70 kg/m       Physical Exam Vitals and nursing note reviewed.  Constitutional:      General: He is not in acute distress.    Appearance: Normal appearance. He is well-developed. He is not ill-appearing.  HENT:     Head: Normocephalic and atraumatic.     Nose: Nose normal.     Mouth/Throat:     Mouth: Mucous membranes are moist.     Pharynx: Oropharynx is clear.  Eyes:     General: No scleral icterus.    Conjunctiva/sclera: Conjunctivae normal.  Cardiovascular:     Rate and Rhythm: Normal rate and regular rhythm.  Pulmonary:     Effort: Pulmonary effort is normal. No respiratory distress.     Breath sounds: Rhonchi present.  Musculoskeletal:     Cervical back: Neck supple.  Skin:    General: Skin is warm and dry.     Capillary Refill: Capillary refill takes less than 2 seconds.  Neurological:     General: No focal deficit present.     Mental Status: He is alert. Mental status is at baseline.     Motor: No weakness.     Gait: Gait normal.  Psychiatric:        Mood and Affect: Mood normal.        Behavior: Behavior normal.      UC Treatments / Results  Labs (all labs ordered are listed, but only abnormal results are displayed) Labs Reviewed - No data to display  EKG   Radiology No results found.  Procedures Procedures (including critical  care time)  Medications Ordered in UC Medications - No data to display  Initial Impression / Assessment and Plan / UC Course  I have reviewed the triage vital signs and the nursing notes.  Pertinent labs & imaging results that were available during my care of the patient were reviewed by me and considered in my medical decision making (see chart for details).   68 year old male presents for 2-week history of productive cough, congestion, fatigue and increasing shortness of breath.  Denies fever, chest pain, sinus pain, nasal congestion, sore throat.  Taking OTC meds without relief.  No history of pulmonary disease or tobacco abuse.  He is afebrile and overall well-appearing.  No acute distress.  Speaking in full sentences.  Normal HEENT exam.  Scattered rhonchi throughout lung fields.  Chest x-ray shows concern for bronchopneumonia.  Discussed this with patient.  Treating at this time with doxycycline, prednisone  and Promethazine  DM.  Encouraged increasing rest and fluids.  Advised to return or go to ER if fever, worsening cough, chest pain or increased breathing problem.  Patient is agreeable.  Also, he did decline a breathing treatment and albuterol  inhaler.  Radiology overread reviewed. No change to treatment plan.    Final Clinical Impressions(s) / UC Diagnoses   Final diagnoses:  Acute cough  Other fatigue  Bronchopneumonia  Shortness of breath     Discharge Instructions      - Your x-ray shows bronchitis and concern for early pneumonia. - I sent antibiotics to the pharmacy as well as prednisone  and cough medication.  Increase your rest and fluids. - You should be starting to feel somewhat better in the next few days. - If you develop a fever or worsening breathing difficulty or weakness please return or go to ER for  reevaluation.     ED Prescriptions     Medication Sig Dispense Auth. Provider   doxycycline (VIBRAMYCIN) 100 MG capsule  (Status: Discontinued) Take 1  capsule (100 mg total) by mouth 2 (two) times daily for 7 days. 14 capsule Arvis Huxley B, PA-C   predniSONE  (DELTASONE ) 20 MG tablet  (Status: Discontinued) Take 2 tablets (40 mg total) by mouth daily for 5 days. 10 tablet Arvis Huxley B, PA-C   promethazine -dextromethorphan (PROMETHAZINE -DM) 6.25-15 MG/5ML syrup  (Status: Discontinued) Take 5 mLs by mouth 4 (four) times daily as needed. 118 mL Arvis Huxley B, PA-C   doxycycline (VIBRAMYCIN) 100 MG capsule Take 1 capsule (100 mg total) by mouth 2 (two) times daily for 7 days. 14 capsule Arvis Huxley B, PA-C   predniSONE  (DELTASONE ) 20 MG tablet Take 2 tablets (40 mg total) by mouth daily for 5 days. 10 tablet Arvis Huxley B, PA-C   promethazine -dextromethorphan (PROMETHAZINE -DM) 6.25-15 MG/5ML syrup Take 5 mLs by mouth 4 (four) times daily as needed. 118 mL Arvis Huxley NOVAK, PA-C      PDMP not reviewed this encounter.   Arvis Huxley NOVAK, PA-C 07/31/24 1209

## 2024-07-31 NOTE — Discharge Instructions (Signed)
-   Your x-ray shows bronchitis and concern for early pneumonia. - I sent antibiotics to the pharmacy as well as prednisone  and cough medication.  Increase your rest and fluids. - You should be starting to feel somewhat better in the next few days. - If you develop a fever or worsening breathing difficulty or weakness please return or go to ER for reevaluation.

## 2024-07-31 NOTE — ED Triage Notes (Signed)
 Pt c/o chest congestion, cough, and fatigue x2weeks  Pt states that he gets weak when doing physical activities  Pt does not know his medications

## 2024-10-11 ENCOUNTER — Ambulatory Visit
Admission: EM | Admit: 2024-10-11 | Discharge: 2024-10-11 | Disposition: A | Discharge status: Home / Self Care | Attending: Emergency Medicine | Admitting: Emergency Medicine

## 2024-10-11 DIAGNOSIS — N3000 Acute cystitis without hematuria: Secondary | ICD-10-CM | POA: Insufficient documentation

## 2024-10-11 LAB — POCT URINE DIPSTICK
Bilirubin, UA: NEGATIVE
Glucose, UA: >=1000 mg/dL — AB
Ketones, POC UA: NEGATIVE mg/dL
Nitrite, UA: POSITIVE — AB
Protein Ur, POC: 100 mg/dL — AB
Spec Grav, UA: 1.015
Urobilinogen, UA: 0.2 U/dL
pH, UA: 5.5

## 2024-10-11 MED ORDER — CIPROFLOXACIN HCL 500 MG PO TABS: 500.0000 mg | ORAL_TABLET | Freq: Two times a day (BID) | ORAL | 0 refills | Status: AC

## 2024-10-11 MED ORDER — PHENAZOPYRIDINE HCL 200 MG PO TABS: 200.0000 mg | ORAL_TABLET | Freq: Three times a day (TID) | ORAL | 0 refills | Status: DC

## 2024-10-11 NOTE — ED Triage Notes (Signed)
 Patient presents to UC for left flank pain and dysuria x 3 days. Concerned with kidney infection. States he took left over antibiotics that urology prescribed him from a previous infection.

## 2024-10-11 NOTE — Discharge Instructions (Addendum)
Take the Cipro twice daily for 7 days with food for treatment of urinary tract infection.  Use the Pyridium every 8 hours as needed for urinary discomfort.  This will turn your urine a bright red-orange.  Increase your oral fluid intake so that you increase your urine production and or flushing your urinary system.  Take an over-the-counter probiotic, such as Culturelle-Align-Activia, 1 hour after each dose of antibiotic to prevent diarrhea or yeast infections from forming.  We will culture urine and change the antibiotics if necessary.  Return for reevaluation, or see your primary care provider, for any new or worsening symptoms.

## 2024-10-11 NOTE — ED Provider Notes (Addendum)
 " [facility]    CSN: [id number] Arrival date & time: 10/11/24  1512      History   Chief Complaint No chief complaint on file.   HPI Mitchell Ortiz is a 68 y.o. male.   HPI  68 year old male with past medical history significant for diabetes, hypertension, kidney stones, status post prostatectomy presents for evaluation of dysuria, urgency, frequency, nocturia that started 3 days ago.  Also pain in the left flank.  No fever or blood in the urine.  Past Medical History:  Diagnosis Date   Diabetes mellitus without complication (HCC)    Hypertension    Kidney stone    Kidney stones     Patient Active Problem List   Diagnosis Date Noted   Pain due to onychomycosis of toenails of both feet 08/11/2022   Pelvic floor weakness, male 01/08/2022   Nocturia 01/08/2022   Mixed stress and urge urinary incontinence 01/08/2022   Elevated alkaline phosphatase level 03/21/2021   Other male erectile dysfunction 01/11/2021   Lichen simplex chronicus 10/26/2020   Benign prostatic hyperplasia with urinary frequency 07/26/2020   Umbilical hernia without obstruction and without gangrene 08/31/2019   Family history of heart disease in brother 07/25/2019   Hyperlipidemia 08/25/2018   Hypertension 02/25/2017   Type 2 diabetes mellitus without complication, without long-term current use of insulin (HCC) 08/21/2016   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 Authorizing Provider  ciprofloxacin  (CIPRO ) 500 MG tablet Take 1 tablet (500 mg total) by mouth 2 (two) times daily for 7 days. 10/11/24  10/18/24 Yes Bernardino Ditch, NP  phenazopyridine  (PYRIDIUM ) 200 MG tablet Take 1 tablet (200 mg total) by mouth 3 (three) times daily. 10/11/24  Yes Bernardino Ditch, NP  albuterol  (VENTOLIN  HFA) 108 (90 Base) MCG/ACT inhaler Inhale 2 puffs into the lungs every 4 (four) hours as needed. 11/22/20   Bernardino Ditch, NP  amLODipine (NORVASC) 10 MG tablet  06/15/20   [provider]  aspirin 81 MG EC tablet Take by mouth. 05/26/16   [provider]  atorvastatin (LIPITOR) 10 MG tablet Take 10 mg by mouth daily. 06/26/20   [provider]  Capsaicin 0.1 % CREA Apply topically. 10/16/21   [provider]  ciprofloxacin  (CILOXAN ) 0.3 % ophthalmic solution Administer 1 drop, every 2 hours, while awake, for 2 days. Then 1 drop, every 4 hours, while awake, for the next 5 days. 02/19/22   Arvis Huxley B, PA-C  clobetasol cream (TEMOVATE) 0.05 % Apply 1 application. topically 2 (two) times daily. 10/16/21   [provider]  econazole nitrate 1 % cream Apply topically. 11/07/21   [provider]  EPINEPHrine  0.3 mg/0.3 mL IJ SOAJ injection SMARTSIG:0.3 Milliliter(s) IM Once PRN 07/03/20   [provider]  fluconazole (DIFLUCAN) 150 MG tablet Take by mouth. 11/07/21   [provider]  glipiZIDE (GLUCOTROL XL) 10 MG 24 hr tablet Take by mouth. 06/27/16   [provider]  hydrocortisone 2.5 % cream Apply 2x a day to affected area (Hips) 08/06/17   [provider]  JANUVIA 100 MG tablet Take 100 mg by mouth daily. 02/05/22   [provider]  losartan (COZAAR) 100 MG tablet Take 100 mg by mouth daily. 11/28/21   [provider]  metFORMIN (GLUCOPHAGE-XR) 500 MG 24 hr tablet Take by mouth. 10/16/21 10/17/22  [provider]  naproxen (NAPROSYN) 375 MG tablet Take 375 mg by mouth 2 (two) times daily. 04/23/20   [provider]  Naproxen Sodium (ALEVE PO) Take by mouth.    [provider]  OZEMPIC, 1 MG/DOSE, 4  MG/3ML SOPN INJECT SUBCUTANEOUSLY 1 MG EVERY WEEK 04/28/24   [provider]  silodosin (RAPAFLO) 8 MG CAPS capsule Take by mouth. 07/12/21   [provider]  sitaGLIPtin (JANUVIA) 100 MG tablet Take 1 tablet by mouth daily. 10/16/21 10/16/22  [provider]  triamcinolone  cream (KENALOG ) 0.1 % Apply 1 application topically 2 (two) times daily. Apply for 2 weeks. May use on face 07/03/20   Van Knee, MD  famotidine  (PEPCID ) 20 MG tablet Take 1 tablet (20 mg total) by mouth 2 (two) times daily. 07/03/20 11/22/20  Van Knee, MD    Family History History reviewed. No pertinent family history.  Social History Social History[1]   Allergies   Bee venom   Review of Systems Review of Systems  Constitutional:  Negative for fever.  Genitourinary:  Positive for dysuria, flank pain, frequency and urgency. Negative for hematuria.  Musculoskeletal:  Positive for back pain.     Physical Exam Triage Vital Signs ED Triage Vitals  Encounter Vitals Group     BP 10/11/24 1526 (!) 127/90     Girls Systolic BP Percentile --      Girls Diastolic BP Percentile --      Boys Systolic BP Percentile --      Boys Diastolic BP Percentile --      Pulse Rate 10/11/24 1526 90     Resp 10/11/24 1526 16     Temp 10/11/24 1526 98.2 F (36.8 C)     Temp Source 10/11/24 1526 Oral     SpO2 10/11/24 1526 99 %     Weight --      Height --      Head Circumference --      Peak Flow --      Pain Score 10/11/24 1523 7     Pain Loc --      Pain Education --      Exclude from Growth Chart --    No data found.  Updated Vital Signs BP (!) 127/90 (BP Location: Left Arm)   Pulse 90   Temp 98.2 F (36.8 C) (Oral)   Resp 16   SpO2 99%   Visual Acuity Right Eye Distance:   Left Eye Distance:   Bilateral Distance:    Right Eye Near:   Left Eye Near:    Bilateral Near:     Physical Exam Vitals and nursing note reviewed.  Constitutional:      Appearance: Normal  appearance. He is not ill-appearing.  HENT:     Head: Normocephalic and atraumatic.  Cardiovascular:     Rate and Rhythm: Normal rate and regular rhythm.     Pulses: Normal pulses.     Heart sounds: Normal heart sounds. No murmur heard.    No friction rub. No gallop.  Pulmonary:     Effort: Pulmonary effort is normal.     Breath sounds: Normal breath sounds. No wheezing, rhonchi or rales.  Abdominal:  Tenderness: There is no right CVA tenderness or left CVA tenderness.  Skin:    General: Skin is warm and dry.     Capillary Refill: Capillary refill takes less than 2 seconds.     Findings: No rash.  Neurological:     General: No focal deficit present.     Mental Status: He is alert and oriented to person, place, and time.      UC Treatments / Results  Labs (all labs ordered are listed, but only abnormal results are displayed) Labs Reviewed  POCT URINE DIPSTICK - Abnormal; Notable for the following components:      Result Value   Clarity, UA cloudy (*)    Glucose, UA >=1,000 (*)    Blood, UA large (*)    Protein Ur, POC =100 (*)    Nitrite, UA Positive (*)    Leukocytes, UA Small (1+) (*)    All other components within normal limits  URINE CULTURE    EKG   Radiology No results found.  Procedures Procedures (including critical care time)  Medications Ordered in UC Medications - No data to display  Initial Impression / Assessment and Plan / UC Course  I have reviewed the triage vital signs and the nursing notes.  Pertinent labs & imaging results that were available during my care of the patient were reviewed by me and considered in my medical decision making (see chart for details).   Patient is a pleasant, nontoxic-appearing 68 year old male presenting for evaluation of UTI symptoms that began 3 days ago.  He reports that he is having urgency and frequency along with dysuria.  He is going multiple times for the day and approximately 4-5 times at night.  He has  not noticed any blood in his urine.  He is also complaining of left-sided low back pain.  He has no CVA tenderness on exam.  I will order urinalysis to assess the presence of UTI.  Urinalysis is cloudy in appearance with greater than 1000 glucose, large RBCs, 100 protein, nitrite positive with small leukocyte esterase.  I will send urine for culture.  The patient is on Januvia which accounts for the greater than 1000 glucose in his urine.  I will discharge him home on Cipro  500 mg twice daily for 7 days for treatment of UTI along with Pyridium  200 mg every 8 hours that he can use as needed for urinary discomfort while the culture is pending.  Return precautions reviewed.  Urine culture grew out greater than 100,000 colony-forming units of Klebsiella pneumoniae.  Susceptibility indicates the bacteria is susceptible to Cipro .  No change in therapy at this time.   Final Clinical Impressions(s) / UC Diagnoses   Final diagnoses:  Acute cystitis without hematuria     Discharge Instructions      Take the Cipro  twice daily for 7 days with food for treatment of urinary tract infection.  Use the Pyridium  every 8 hours as needed for urinary discomfort.  This will turn your urine a bright red-orange.  Increase your oral fluid intake so that you increase your urine production and or flushing your urinary system.  Take an over-the-counter probiotic, such as Culturelle-Align-Activia, 1 hour after each dose of antibiotic to prevent diarrhea or yeast infections from forming.  We will culture urine and change the antibiotics if necessary.  Return for reevaluation, or see your primary care provider, for any new or worsening symptoms.      ED Prescriptions     Medication  Sig Dispense Auth. Provider   ciprofloxacin  (CIPRO ) 500 MG tablet Take 1 tablet (500 mg total) by mouth 2 (two) times daily for 7 days. 14 tablet Bernardino Ditch, NP   phenazopyridine  (PYRIDIUM ) 200 MG tablet Take 1 tablet (200 mg  total) by mouth 3 (three) times daily. 6 tablet Bernardino Ditch, NP      PDMP not reviewed this encounter.    Bernardino Ditch, NP 10/11/24 1536     [1]  Social History Tobacco Use   Smoking status: Never   Smokeless tobacco: Never  Vaping Use   Vaping status: Never Used  Substance Use Topics   Alcohol use: No    Alcohol/week: 0.0 standard drinks of alcohol   Drug use: Never     Bernardino Ditch, NP 10/14/24 0755  "

## 2024-10-13 LAB — URINE CULTURE
Culture: >=100000 — AB
Special Requests: NORMAL

## 2024-10-14 ENCOUNTER — Ambulatory Visit (HOSPITAL_COMMUNITY): Payer: Self-pay

## 2024-11-17 ENCOUNTER — Ambulatory Visit
Admission: EM | Admit: 2024-11-17 | Discharge: 2024-11-17 | Disposition: A | Discharge status: Home / Self Care | Attending: Family Medicine | Admitting: Family Medicine

## 2024-11-17 DIAGNOSIS — N390 Urinary tract infection, site not specified: Secondary | ICD-10-CM | POA: Diagnosis present

## 2024-11-17 LAB — POCT URINE DIPSTICK
Bilirubin, UA: NEGATIVE
Glucose, UA: >=1000 mg/dL — AB
Ketones, POC UA: NEGATIVE mg/dL
Nitrite, UA: POSITIVE — AB
Protein Ur, POC: 100 mg/dL — AB
Spec Grav, UA: 1.02
Urobilinogen, UA: 0.2 U/dL
pH, UA: 5.5

## 2024-11-17 MED ORDER — CIPROFLOXACIN HCL 500 MG PO TABS: 500.0000 mg | ORAL_TABLET | Freq: Two times a day (BID) | ORAL | 0 refills | Status: AC

## 2024-11-17 MED ORDER — PHENAZOPYRIDINE HCL 200 MG PO TABS: 200.0000 mg | ORAL_TABLET | Freq: Three times a day (TID) | ORAL | 0 refills | Status: AC

## 2024-11-17 NOTE — Discharge Instructions (Signed)
 You have a urinary tract infection. I sent your urine for culture to be sure the antibiotic prescribed will treat your infection. Someone may call you to change antibiotics. Stop by the pharmacy to pick up your prescriptions.  Follow up with your primary care provider or return to the urgent care, if not improving.

## 2024-11-17 NOTE — ED Triage Notes (Signed)
 Patient to Urgent Care with complaints of  lower back pain/ dysuria.  Symptoms x4 days.   No otc meds.

## 2024-11-19 ENCOUNTER — Ambulatory Visit (HOSPITAL_COMMUNITY): Payer: Self-pay

## 2024-11-19 LAB — URINE CULTURE: Culture: >=100000 — AB
